# Patient Record
Sex: Male | Born: 1975 | Race: White | Hispanic: No | Marital: Married | Smoking: Never smoker
Health system: Southern US, Community
[De-identification: ages and names within clinical notes are randomized; demographics above are authoritative.]

## PROBLEM LIST (undated history)

## (undated) DIAGNOSIS — H9190 Unspecified hearing loss, unspecified ear: Secondary | ICD-10-CM

## (undated) DIAGNOSIS — I10 Essential (primary) hypertension: Secondary | ICD-10-CM

## (undated) DIAGNOSIS — F429 Obsessive-compulsive disorder, unspecified: Secondary | ICD-10-CM

## (undated) DIAGNOSIS — F419 Anxiety disorder, unspecified: Secondary | ICD-10-CM

## (undated) DIAGNOSIS — K602 Anal fissure, unspecified: Secondary | ICD-10-CM

## (undated) DIAGNOSIS — K219 Gastro-esophageal reflux disease without esophagitis: Secondary | ICD-10-CM

## (undated) DIAGNOSIS — J309 Allergic rhinitis, unspecified: Secondary | ICD-10-CM

## (undated) HISTORY — PX: LASIK: SHX215

## (undated) HISTORY — DX: Obsessive-compulsive disorder, unspecified: F42.9

## (undated) HISTORY — PX: NASAL SEPTUM SURGERY: SHX37

## (undated) HISTORY — DX: Essential (primary) hypertension: I10

## (undated) HISTORY — DX: Anxiety disorder, unspecified: F41.9

## (undated) HISTORY — DX: Anal fissure, unspecified: K60.2

## (undated) HISTORY — DX: Allergic rhinitis, unspecified: J30.9

## (undated) HISTORY — DX: Gastro-esophageal reflux disease without esophagitis: K21.9

## (undated) HISTORY — DX: Unspecified hearing loss, unspecified ear: H91.90

## (undated) HISTORY — PX: VASECTOMY: SHX75

---

## 2001-01-26 ENCOUNTER — Encounter: Payer: Self-pay | Admitting: Emergency Medicine

## 2001-01-26 ENCOUNTER — Emergency Department (HOSPITAL_COMMUNITY): Admission: EM | Admit: 2001-01-26 | Discharge: 2001-01-26 | Payer: Self-pay | Admitting: Emergency Medicine

## 2016-12-11 ENCOUNTER — Encounter: Payer: Self-pay | Admitting: Gastroenterology

## 2016-12-23 ENCOUNTER — Encounter: Payer: Self-pay | Admitting: Neurology

## 2016-12-23 ENCOUNTER — Ambulatory Visit (INDEPENDENT_AMBULATORY_CARE_PROVIDER_SITE_OTHER): Payer: 59 | Admitting: Neurology

## 2016-12-23 VITALS — BP 125/82 | HR 62 | Ht 70.0 in | Wt 288.0 lb

## 2016-12-23 DIAGNOSIS — R5383 Other fatigue: Secondary | ICD-10-CM

## 2016-12-23 DIAGNOSIS — Z6841 Body Mass Index (BMI) 40.0 and over, adult: Secondary | ICD-10-CM | POA: Insufficient documentation

## 2016-12-23 DIAGNOSIS — R0683 Snoring: Secondary | ICD-10-CM

## 2016-12-23 DIAGNOSIS — G478 Other sleep disorders: Secondary | ICD-10-CM

## 2016-12-23 DIAGNOSIS — F513 Sleepwalking [somnambulism]: Secondary | ICD-10-CM | POA: Insufficient documentation

## 2016-12-23 NOTE — Progress Notes (Signed)
SLEEP MEDICINE CLINIC   Provider:  Melvyn Novas, M D  Primary Care Physician:  Tracey Harries, MD   Referring Provider: Tracey Harries, MD   Chief Complaint  Patient presents with  . New Patient (Initial Visit)    pt alone, states that he has difficulty with his sleep, he gave an example of last night pt went to bed 11:30 woke up at 2 went back to sleep woke up 4 and then he couldnt go back to sleep. He went downstairs and then fell back asleep downstairs. pt states this seems to be a pattern.  pt has been off roatating shifts since 2008. pt says it has to be completly quiet to fall asleep. tired thru out the day due to the lack of sleep    HPI:  Carl Taylor is a 41 y.o. male , seen here as in a referral  from Dr. Everlene Other for a sleep evaluation,  I have the pleasure of seeing Carl Taylor today, a Caucasian, right-handed, married gentleman and father of 2, who reports difficulties to sleep through the night. He carries a diagnosis of obsessive-compulsive disorder, well-controlled on SSRI medication, he has had weight gain issues for the last 4 years or longer, he developed a fatty liver. He reports difficulties with daytime fatigue, has 2-3 times nocturia, and his wife observed him snoring. He has trouble to go to sleep, needs a very cool, quiet and dark bedroom.  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   Chief complaint according to patient : "I am unable to sleep with my wife watching TV in the bedroom until midnight ".   Sleep habits are as follows: The patient goes to bed at 11 pm and does not sleep immediately, he needs until midnight - and he wakes up at 2 and 4 AM. His wife has to give the baby medicine at midnight - and stays up- watching TV in the bedroom. He has tried to sleep in a recliner downstairs. He prefers a fan in the bedroom. He got a contour pillow, uses only one. He is a back sleeper since he  gained weight and has back pain. His snoring is loud. Once he is asleep after that, he is difficult to arouse.  He has nocturnal headaches- throbbing , waking him. He wakes up with throbbing headaches , too.  He rememberes difficulties to get up in AM for school. He is at work at 8.30 AM, always late . He reports he  feels all over achy and fatigue. The first step out of bed is horrible - joint pain and stiffness. His headaches resolve after the first caffeine of the day.   Sleep medical history and family sleep history: sleep talking , possible sleep walking. He does not remember the long conversations at night - but his wife and children do. DDD. 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 Chronic fatigue . No tonsillectomy in childhood.  ADD/ OCD.  Septoplasty 12 yaers ago.   His Baby daughter was born with atresia of vagina, rectum and urethra. At 75 month age she developed ADEM. Steroid treatment. Developmentally delayed, hemiparetic on the left side.  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 She has brain demyelination.  She is now 56 years old.  Their son is healthy but had ADD at age 60 .  Social history:  Married, 2 children, one with special needs,  tobacco use -  chews tobacco. ETOH none, Caffeine : lots of caffeine . Coffee or soda throughout the day, 7-8 a day.   Review of Systems: Out of a complete 14 system review, the patient complains of only the following symptoms, and all other reviewed systems are negative.    Epworth score 7, Fatigue severity score 60  , depression score n/a    Social History   Social History  . Marital status: Married    Spouse name: N/A  . Number of children: N/A  . Years of education: N/A   Occupational History  . Not on file.   Social History Main Topics  . Smoking status: Never Smoker  . Smokeless tobacco: Current User    Types: Chew  . Alcohol use No  . Drug use: No  . Sexual activity: Not on file   Other Topics Concern  . Not on  file   Social History Narrative  . No narrative on file    No family history on file.  Past Medical History:  Diagnosis Date  . Allergic rhinitis   . Anxiety   . GERD (gastroesophageal reflux disease)   . OCD (obsessive compulsive disorder)     Past Surgical History:  Procedure Laterality Date  . LASIK    . NASAL SEPTUM SURGERY    . VASECTOMY      Current Outpatient Prescriptions  Medication Sig Dispense Refill  . cetirizine (ZYRTEC) 10 MG tablet Take 20 mg by mouth.    . cyclobenzaprine (FLEXERIL) 10 MG tablet Take 10 mg by mouth.    . DEXILANT 60 MG capsule     . fluticasone (FLONASE) 50 MCG/ACT nasal spray     . lisinopril (PRINIVIL,ZESTRIL) 20 MG tablet Take 20 mg by mouth.    Marland Kitchen PARoxetine (PAXIL) 20 MG tablet Take 20 mg by mouth.     No current facility-administered medications for this visit.     Allergies as of 12/23/2016 - Review Complete 12/23/2016  Allergen Reaction Noted  . Bee venom Other (See Comments) 10/25/2013    Vitals: BP 125/82   Pulse 62   Ht 5\' 10"  (1.778 m)   Wt 288 lb (130.6 kg)   BMI 41.32 kg/m  Last Weight:  Wt Readings from Last 1 Encounters:  12/23/16 288 lb (130.6 kg)   UJW:JXBJ mass index is 41.32 kg/m.     Last Height:   Ht Readings from Last 1 Encounters:  12/23/16 5\' 10"  (1.778 m)    Physical exam:  General: The patient is awake, alert and appears not in acute distress. The patient is well groomed. Head: Normocephalic, atraumatic. Neck is supple. Mallampati 2 ,  neck circumference:19.25 . Nasal airflow patent - had deviated septum surgery,    Retrognathia is not seen.  Cardiovascular:  Regular rate and rhythm , without  murmurs or carotid bruit, and without distended neck veins. Respiratory: Lungs are clear to auscultation. Skin:  Without evidence of edema, or rash Trunk: BMI is 41.3 .  Neurologic exam : The patient is awake and alert, oriented to place and time.   Speech is fluent,  without being pressured, no  dysarthria, dysphonia or aphasia.  Mood and affect are appropriate.  Cranial nerves: Pupils are equal and briskly reactive to light.  Extraocular movements  in vertical and horizontal planes intact and without nystagmus. Visual fields by finger perimetry are intact. Hearing to finger rub intact.  Facial sensation intact to fine touch. Facial motor strength is symmetric and tongue and uvula move  midline. Shoulder shrug was symmetrical.   Motor exam:   Normal tone, muscle bulk and symmetric strength in all extremities.Sensory:  Fine touch, pinprick and vibration were tested in all extremities. Coordination: Finger-to-nose maneuver  normal without evidence of ataxia, dysmetria or tremor. Gait and station: Patient walks without assistive device and is able unassisted to climb up to the exam table. Strength within normal limits. Stance is stable and normal.  Deep tendon reflexes: in the  upper and lower extremities are symmetric and intact. Babinski maneuver response is downgoing.  Assessment:  After physical and neurologic examination, review of laboratory studies,  Personal review of imaging studies, reports of other /same  Imaging studies, results of polysomnography and / or neurophysiology testing and pre-existing records as far as provided in visit., my assessment is   1)hypersomnia - moderate , but high degree of fatigue. He never naps, he has no sleep attacks. He snores.   this patient is at a high risk, risk factors  of having OSA- morbid obsese by BMI and neck size, risk are in addition his age and gender. I will order a sleep study.   2) sleep hygiene is poor, 5-6 hours in a good night, surrounding bedroom has to be modified - calming- quiet , dark and cool.  Make 10.30 your bedtime, take a hot shower before bedtime, no screen light.   3)  Obesity-  Low carb diet - not a no carb diet . Not Atkins! Exercise after dinner , but more than 2 hours before bedtime.  Caffeine reduction after lunch  .  The patient was advised of the nature of the diagnosed disorder , the treatment options and the  risks for general health and wellness arising from not treating the condition.   I spent more than 45 minutes of face to face time with the patient.  Greater than 50% of time was spent in counseling and coordination of care. We have discussed the diagnosis and differential and I answered the patient's questions.    Plan:  Treatment plan and additional workup :   1) OSA risk is high- PSG with SPLIT study,  2) cluster headache, caffeine abuse, need hypoxemia watch, capnography if possible.   3) insomnia guidelines printed.   Melvyn Novas, MD 12/23/2016, 8:21 AM  Certified in Neurology by ABPN Certified in Sleep Medicine by [facility] [address] [place], [place] [address]

## 2016-12-23 NOTE — Patient Instructions (Signed)

## 2017-01-15 ENCOUNTER — Encounter: Payer: Self-pay | Admitting: Gastroenterology

## 2017-01-15 ENCOUNTER — Ambulatory Visit (INDEPENDENT_AMBULATORY_CARE_PROVIDER_SITE_OTHER): Payer: 59 | Admitting: Gastroenterology

## 2017-01-15 VITALS — BP 110/78 | HR 68 | Ht 70.0 in | Wt 266.0 lb

## 2017-01-15 DIAGNOSIS — L290 Pruritus ani: Secondary | ICD-10-CM | POA: Diagnosis not present

## 2017-01-15 DIAGNOSIS — K921 Melena: Secondary | ICD-10-CM | POA: Diagnosis not present

## 2017-01-15 MED ORDER — CLOTRIMAZOLE 1 % EX CREA: TOPICAL_CREAM | CUTANEOUS | 0 refills | Status: AC

## 2017-01-15 NOTE — Progress Notes (Signed)
History of Present Illness: This is a 41 year old male referred by Tracey Harries, MD for the evaluation of hematochezia and anal itching. Patient relates he was diagnosed with an anal fissure several years ago. He has had problems with intermittent constipation and hard stools for several years. He notes anal and perianal itching that bothers him most days for the past year. Occasionally notes a small amount of bright red blood with bowel movements. She was prescribed a hemorrhoidal cream but does not recall the name of the product. It provides temporary relief of symptoms. No prior colonoscopy. Denies weight loss, abdominal pain, constipation, diarrhea, change in stool caliber, melena, nausea, vomiting, dysphagia, reflux symptoms, chest pain.   Allergies  Allergen Reactions  . Bee Venom Other (See Comments)   Outpatient Medications Prior to Visit  Medication Sig Dispense Refill  . cetirizine (ZYRTEC) 10 MG tablet Take 20 mg by mouth.    . cyclobenzaprine (FLEXERIL) 10 MG tablet Take 10 mg by mouth.    . DEXILANT 60 MG capsule     . fluticasone (FLONASE) 50 MCG/ACT nasal spray     . lisinopril (PRINIVIL,ZESTRIL) 20 MG tablet Take 20 mg by mouth.    Marland Kitchen PARoxetine (PAXIL) 20 MG tablet Take 20 mg by mouth.     No facility-administered medications prior to visit.    Past Medical History:  Diagnosis Date  . Allergic rhinitis   . Anal fissure   . Anxiety   . GERD (gastroesophageal reflux disease)   . Hearing loss   . Hypertension   . OCD (obsessive compulsive disorder)    Past Surgical History:  Procedure Laterality Date  . LASIK    . NASAL SEPTUM SURGERY    . VASECTOMY     Social History   Social History  . Marital status: Married    Spouse name: N/A  . Number of children: 2  . Years of education: N/A   Social History Main Topics  . Smoking status: Never Smoker  . Smokeless tobacco: Current User    Types: Chew  . Alcohol use No  . Drug use: No  . Sexual activity: Not  Asked   Other Topics Concern  . None   Social History Narrative  . None   Family History  Problem Relation Age of Onset  . Hypertension Paternal Grandfather   . Diabetes Paternal Grandfather   . Autoimmune disease Daughter        Review of Systems: Pertinent positive and negative review of systems were noted in the above HPI section. All other review of systems were otherwise negative.    Physical Exam: General: Well developed, well nourished, no acute distress Head: Normocephalic and atraumatic Eyes:  sclerae anicteric, EOMI Ears: Normal auditory acuity Mouth: No deformity or lesions Neck: Supple, no masses or thyromegaly Lungs: Clear throughout to auscultation Heart: Regular rate and rhythm; no murmurs, rubs or bruits Abdomen: Soft, non tender and non distended. No masses, hepatosplenomegaly or hernias noted. Normal Bowel sounds Rectal: perianal patchy erythema and hypopigmentation in a circular area around the anus, no anal abnormalities, no fissure noted, normal anal skin folds, no internal lesions noted, no anal canal tenderness, Hemoccult-negative brown stool Musculoskeletal: Symmetrical with no gross deformities  Skin: No lesions on visible extremities Pulses:  Normal pulses noted Extremities: No clubbing, cyanosis, edema or deformities noted Neurological: Alert oriented x 4, grossly nonfocal Cervical Nodes:  No significant cervical adenopathy Inguinal Nodes: No significant inguinal adenopathy Psychological:  Alert and  cooperative. Normal mood and affect  Assessment and Recommendations:  1.  Perianal dermatitis. Lotrimin cream 3 times a day for 1 week. If symptoms persist proceed with dermatology referral. May use Tucks pads when necessary but he is advised not to use any other creams lotions or ointments.  2. Intermittent hematochezia. Possible internal hemorrhoids. Rule out colorectal neoplasms, proctitis and other disorders. Schedule colonoscopy. The risks  (including bleeding, perforation, infection, missed lesions, medication reactions and possible hospitalization or surgery if complications occur), benefits, and alternatives to colonoscopy with possible biopsy and possible polypectomy were discussed with the patient and they consent to proceed.    2. Hematochezia.  cc: Tracey Harries, MD

## 2017-01-15 NOTE — Patient Instructions (Signed)
You have been scheduled for a colonoscopy. Please follow written instructions given to you at your visit today.  Please pick up your prep supplies at the pharmacy within the next 1-3 days. If you use inhalers (even only as needed), please bring them with you on the day of your procedure. Your physician has requested that you go to www.startemmi.com and enter the access code given to you at your visit today. This web site gives a general overview about your procedure. However, you should still follow specific instructions given to you by our office regarding your preparation for the procedure.  We have sent the following medications to your pharmacy for you to pick up at your convenience: Lotrimin AF Cream-Apply perianally twice daily x 1 week  Please purchase the following medications over the counter and take as directed: TUCKS pads as needed for rectal itching  If you are age 42 or older, your body mass index should be between 23-30. Your Body mass index is 38.17 kg/m. If this is out of the aforementioned range listed, please consider follow up with your Primary Care Provider.  If you are age 36 or younger, your body mass index should be between 19-25. Your Body mass index is 38.17 kg/m. If this is out of the aformentioned range listed, please consider follow up with your Primary Care Provider.

## 2017-02-12 ENCOUNTER — Ambulatory Visit (INDEPENDENT_AMBULATORY_CARE_PROVIDER_SITE_OTHER): Payer: 59 | Admitting: Neurology

## 2017-02-12 DIAGNOSIS — F513 Sleepwalking [somnambulism]: Secondary | ICD-10-CM

## 2017-02-12 DIAGNOSIS — G4733 Obstructive sleep apnea (adult) (pediatric): Secondary | ICD-10-CM

## 2017-02-12 DIAGNOSIS — R5383 Other fatigue: Secondary | ICD-10-CM

## 2017-02-12 DIAGNOSIS — J9611 Chronic respiratory failure with hypoxia: Secondary | ICD-10-CM

## 2017-02-12 DIAGNOSIS — R0683 Snoring: Secondary | ICD-10-CM

## 2017-02-12 DIAGNOSIS — G4734 Idiopathic sleep related nonobstructive alveolar hypoventilation: Secondary | ICD-10-CM

## 2017-02-12 DIAGNOSIS — Z6841 Body Mass Index (BMI) 40.0 and over, adult: Secondary | ICD-10-CM

## 2017-02-12 DIAGNOSIS — G478 Other sleep disorders: Secondary | ICD-10-CM

## 2017-02-12 DIAGNOSIS — J9612 Chronic respiratory failure with hypercapnia: Secondary | ICD-10-CM

## 2017-02-16 NOTE — Addendum Note (Signed)
Addended by: Melvyn NovasHMEIER, Philo Kurtz on: 02/16/2017 04:52 PM   Modules accepted: Orders

## 2017-02-16 NOTE — Procedures (Signed)
PATIENT'S NAME:  Carl Taylor, Carl Taylor DOB:      May 27, 1975      MR#:    [id number]     DATE OF RECORDING: 02/12/2017 REFERRING M.D.:  Tracey Harries, MD Study Performed:   Baseline Polysomnogram HISTORY: sleep talking, walking, Class 3 obesity, snoring and fatigue.  Mr. Graumann reports difficulties to sleep through the night. He carries a diagnosis of obsessive-compulsive disorder, well-controlled on SSRI medication, he has had weight gain issues for the last 4 years or longer, he developed a fatty liver. He reports difficulties with daytime fatigue, has 2-3 times nocturia, and his wife observed him snoring.  The patient endorsed the Epworth Sleepiness Scale at 7 points.   The patient's weight 288 pounds with a height of 70 (inches), resulting in a BMI of 41.3 kg/m2. The patient's neck circumference measured 19 inches.  CURRENT MEDICATIONS: Zyrtec, Flexeril, Dexilant, Flonase, Zestril, Paxil   PROCEDURE:  This is a multichannel digital polysomnogram utilizing the Somnostar 11.2 system.  Electrodes and sensors were applied and monitored per AASM Specifications.   EEG, EOG, Chin and Limb EMG, were sampled at 200 Hz.  ECG, Snore and Nasal Pressure, Thermal Airflow, Respiratory Effort, CPAP Flow and Pressure, Oximetry was sampled at 50 Hz. Digital video and audio were recorded.      BASELINE STUDY : Lights Out was at 22:15 and Lights On at 05:00.  Total recording time (TRT) was 405 minutes, with a total sleep time (TST) of 365 minutes.   The patient's sleep latency was 7.5 minutes.  REM latency was 336.5 minutes.  The sleep efficiency was 90.1 %.     SLEEP ARCHITECTURE: WASO (Wake after sleep onset) was 33.5 minutes.  There were 23 minutes in Stage N1, 240 minutes Stage N2, 46 minutes Stage N3 and 56 minutes in Stage REM.  The percentage of Stage N1 was 6.3%, Stage N2 was 65.8%, Stage N3 was 12.6% and Stage R (REM sleep) was 15.3%.   RESPIRATORY ANALYSIS:  There were a total of 59 respiratory events:  11  obstructive apneas, 1 central apnea and 47 hypopneas with 0 respiratory event related arousals (RERAs).    The total APNEA/HYPOPNEA INDEX (AHI) was 9.7/hour and the total RESPIRATORY DISTURBANCE INDEX was 9.7 /hour.  0 events occurred in REM sleep and 95 events in NREM.  The REM AHI was 0.0 /hour, versus a non-REM AHI of 11.5.  The patient spent 86.5 minutes of total sleep time in the supine position and 279 minutes in non-supine.  The supine AHI was 31.2 versus a non-supine AHI of 3.0.  OXYGEN SATURATION & C02:  The Wake baseline 02 saturation was 96%, with the lowest being 80%. Time spent below 89% saturation equaled 54 minutes.  End Tidal CO2 during sleep was 42.7 torr.  During REM, the highest End Tidal CO2 was 43.4 torr.  During NREM sleep, the highest End Tidal CO2 was 42.6 torr.  Total sleep time greater than 40 torr was 80.50 minutes.   PERIODIC LIMB MOVEMENTS:   The patient had a total of 74 Periodic Limb Movements.  The Periodic Limb Movement (PLM) index was 12.2 and the PLM Arousal index was 1.2/hour. The arousals were noted as: 43 were spontaneous, 7 were associated with PLMs, and 49 were associated with respiratory events.  Audio and video analysis did not show any abnormal or unusual movements, behaviors, phonations or vocalizations.   The patient took 2 bathroom breaks. Loud Snoring was noted in supine. EKG was in keeping with normal  sinus rhythm (NSR). Post-study, the patient indicated that sleep was the same as usual.    IMPRESSION:  1. Obstructive Sleep Apnea(OSA) 2. Periodic Limb Movement Disorder (PLMD) 3. Snoring 4. Hypoxemia and hypercapnia present  RECOMMENDATIONS:  1. Advise full-night, attended, CPAP titration study to optimize therapy. This patient may need additional oxygen supplementation.    2. There were periodic limb movements of sleep (PLMS) with some associated sleep disruption.  Consider treating the PLMS primarily if correlated to RLS.   3. Positional  therapy is advised. 4. Avoid sedative-hypnotics which may worsen sleep apnea, alcohol and tobacco (as applicable). 5. Advise to lose weight, diet and exercise if not contraindicated (BMI 41.3). 6. Further information regarding OSA may be obtained from [employer] (www.sleepfoundation.org) or American Sleep Apnea Association (www.sleepapnea.org). 7. A follow up appointment [facility]. The referring provider will be notified of the results.      I certify that I have reviewed the entire raw data recording prior to the issuance of this report in accordance with the Standards of Accreditation of the American Academy of Sleep Medicine (AASM)      Melvyn Novasarmen Tamirra Sienkiewicz, MD    02-16-2017  Diplomat, American Board of Psychiatry and Neurology  Diplomat, American Board of Sleep Medicine Medical Director of [facility]

## 2017-02-17 ENCOUNTER — Telehealth: Payer: Self-pay | Admitting: Neurology

## 2017-02-17 NOTE — Telephone Encounter (Signed)
I called pt. I advised pt that Dr. Vickey Hugerohmeier reviewed their sleep study results and found that pt has sleep apnea. Dr. Vickey Hugerohmeier recommends that pt return for a CPAP titration study to assess what pressure to set CPAP. Informed the pt that the sleep lab will contact him once insurance approval has went thru. Pt verbalized understanding of results. Pt had no questions at this time but was encouraged to call back if questions arise.

## 2017-02-17 NOTE — Telephone Encounter (Signed)
-----   Message from Melvyn Novasarmen Dohmeier, MD sent at 02/16/2017  4:52 PM EDT ----- Apnea is found , associated with hypercapnia and hypoxemia- this requires CPAP treatment. Further recommendations of weight loss and avoiding supine sleep .  Cc Dr Everlene OtherBouska

## 2017-03-04 ENCOUNTER — Encounter: Payer: Self-pay | Admitting: Gastroenterology

## 2017-03-12 ENCOUNTER — Encounter: Payer: 59 | Admitting: Gastroenterology

## 2017-04-02 ENCOUNTER — Ambulatory Visit (INDEPENDENT_AMBULATORY_CARE_PROVIDER_SITE_OTHER): Payer: 59 | Admitting: Neurology

## 2017-04-02 DIAGNOSIS — G4733 Obstructive sleep apnea (adult) (pediatric): Secondary | ICD-10-CM

## 2017-04-02 DIAGNOSIS — J9611 Chronic respiratory failure with hypoxia: Secondary | ICD-10-CM

## 2017-04-02 DIAGNOSIS — G4734 Idiopathic sleep related nonobstructive alveolar hypoventilation: Secondary | ICD-10-CM

## 2017-04-02 DIAGNOSIS — G478 Other sleep disorders: Secondary | ICD-10-CM

## 2017-04-02 DIAGNOSIS — R5383 Other fatigue: Secondary | ICD-10-CM

## 2017-04-02 DIAGNOSIS — Z6841 Body Mass Index (BMI) 40.0 and over, adult: Secondary | ICD-10-CM

## 2017-04-02 DIAGNOSIS — R0683 Snoring: Secondary | ICD-10-CM

## 2017-04-02 DIAGNOSIS — J9612 Chronic respiratory failure with hypercapnia: Secondary | ICD-10-CM

## 2017-04-14 ENCOUNTER — Other Ambulatory Visit: Payer: Self-pay | Admitting: Neurology

## 2017-04-14 DIAGNOSIS — G478 Other sleep disorders: Secondary | ICD-10-CM

## 2017-04-14 DIAGNOSIS — G4733 Obstructive sleep apnea (adult) (pediatric): Secondary | ICD-10-CM

## 2017-04-14 DIAGNOSIS — Z6841 Body Mass Index (BMI) 40.0 and over, adult: Principal | ICD-10-CM

## 2017-04-14 NOTE — Procedures (Signed)
PATIENT'S NAME:  Vanessa KickFuller, Navid DOB:      Aug 28, 1975      MR#:    [id number]     DATE OF RECORDING: 04/02/2017 REFERRING M.D.:  Tracey Harriesavid Bouska, MD Study Performed:   Titration to CPAP HISTORY:  Mr. Toni ArthursFuller is returning for CPAP titration following a PSG performed on 02/12/2017 which resulted in an AHI of 9.7, supine AHI of 31.1/hr.  and low SpO2 of 80%.There were frequent PLMs. The baseline study documented prolonged hypercapnia.  The patient endorsed the Epworth Sleepiness Scale at 7 points.  The patient's weight 289 pounds with a height of 70 (inches), resulting in a BMI of 41.3 kg/m2.The patient's neck circumference measured 19 inches.  CURRENT MEDICATIONS: Zyrtec, Flexeril, Dexilant, Flonase, Zestril, Paxil  PROCEDURE:  This is a multichannel digital polysomnogram utilizing the SomnoStar 11.2 system.  Electrodes and sensors were applied and monitored per AASM Specifications.   EEG, EOG, Chin and Limb EMG, were sampled at 200 Hz.  ECG, Snore and Nasal Pressure, Thermal Airflow, Respiratory Effort, CPAP Flow and Pressure, Oximetry was sampled at 50 Hz. Digital video and audio were recorded.      CPAP was initiated at 5 cmH20 with heated humidity per AASM split night standards and pressure was advanced to 7/7cmH20 because of hypopneas, apneas and desaturations.  At a PAP pressure of 7 cmH20, there was a reduction of the AHI to 0.0 with improvement of the sleep apnea. The Spo2 nadir remained low at 85%, sleep efficiency was good at 97%.    Lights Out was at 00:04 and Lights On at 06:37. Total recording time (TRT) was 393.5 minutes, with a total sleep time (TST) of 339.5 minutes. The patient's sleep latency was 22.5 minutes with 0.5 minutes of wake time after sleep onset. REM latency was 244.5 minutes.  The sleep efficiency was 86.3 %.    SLEEP ARCHITECTURE: WASO (Wake after sleep onset) was 31 minutes.  There were 29.5 minutes in Stage N1, 120.5 minutes Stage N2, 68 minutes Stage N3 and 121.5 minutes in  Stage REM.  The percentage of Stage N1 was 8.7%, of Stage N2 was 35.5%, of Stage N3 was 20.0% and Stage R (REM sleep) was 35.8%.  RESPIRATORY ANALYSIS:  There was a total of 0 respiratory events: The patient also had 0 respiratory event related arousals (RERAs).     The total APNEA/HYPOPNEA INDEX (AHI) was 0.0 /hour and REM and NREM AHI were 0.0 /hr. The patient spent 198.5 minutes of total sleep time in the supine position and 141 minutes in non-supine. The supine AHI was 0.0, versus a non-supine AHI of 0.0.  OXYGEN SATURATION & C02:  The baseline 02 saturation was 95%, with the lowest being 84%. Time spent below 89% saturation equaled 8 minutes. PERIODIC LIMB MOVEMENTS:  The patient had a total of 58 Periodic Limb Movements. The Periodic Limb Movement (PLM) index was 10.3 and the PLM Arousal index was 0.2 /hour. Post-study, the patient indicated that sleep was the same as usual.     DIAGNOSIS;              2.   Obstructive Sleep Apnea responding well to 7 cm water CPAP.  1. Hypoxemia resolved under CPAP.  PLANS/RECOMMENDATIONS: The patient was fitted with a Respironics Dream wear mask in medium size. Auto-titration capable CPAP shall be set to 7 cm water, under heated humidity.   A follow up appointment will be scheduled in [facility].   Please  call [phone] with any questions.     I certify that I have reviewed the entire raw data recording prior to the issuance of this report in accordance with the Standards of Accreditation of the American Academy of Sleep Medicine (AASM)   Melvyn Novasarmen Jaythen Hamme, M.D.    04-14-2017  Diplomat, American Board of Psychiatry and Neurology  Diplomat, American Board of Sleep Medicine Medical Director, [facility]

## 2017-04-15 ENCOUNTER — Telehealth: Payer: Self-pay | Admitting: Neurology

## 2017-04-15 NOTE — Telephone Encounter (Signed)
Called patient to discuss sleep study results. No answer at this time. LVM for the patient to call back.   

## 2017-04-15 NOTE — Telephone Encounter (Signed)
I called pt. I advised pt that Dr. Vickey Hugerohmeier reviewed their sleep study results and found that pt has sleep apnea. Dr. Vickey Hugerohmeier recommends that pt starts CPAP. I reviewed PAP compliance expectations with the pt. Pt is agreeable to starting a CPAP. I advised pt that an order will be sent to a DME, [facility], and [facility] will call the pt within about one week after they file with the pt's insurance. [facility] will show the pt how to use the machine, fit for masks, and troubleshoot the CPAP if needed. A follow up appt was made for insurance purposes with Darrol Angelarolyn Martin, NP on July 13 2017 at 9:15 am. Pt verbalized understanding to arrive 15 minutes early and bring their CPAP. A letter with all of this information in it will be mailed to the pt as a reminder. I verified with the pt that the address we have on file is correct. Pt verbalized understanding of results. Pt had no questions at this time but was encouraged to call back if questions arise.

## 2017-05-14 ENCOUNTER — Encounter: Payer: 59 | Admitting: Gastroenterology

## 2017-07-01 NOTE — Telephone Encounter (Signed)
Patient called today to change his apt due to the fact that he had a work event that came up. I was able to place him in on another opening for the 13th with Dr Vickey Hugerohmeier

## 2017-07-05 ENCOUNTER — Encounter: Payer: Self-pay | Admitting: Neurology

## 2017-07-07 ENCOUNTER — Encounter: Payer: Self-pay | Admitting: Neurology

## 2017-07-07 ENCOUNTER — Ambulatory Visit: Payer: 59 | Admitting: Neurology

## 2017-07-07 VITALS — BP 138/88 | HR 63 | Ht 70.0 in | Wt 255.0 lb

## 2017-07-07 DIAGNOSIS — Z6841 Body Mass Index (BMI) 40.0 and over, adult: Secondary | ICD-10-CM

## 2017-07-07 DIAGNOSIS — F513 Sleepwalking [somnambulism]: Secondary | ICD-10-CM | POA: Diagnosis not present

## 2017-07-07 DIAGNOSIS — R0683 Snoring: Secondary | ICD-10-CM | POA: Diagnosis not present

## 2017-07-07 DIAGNOSIS — G4733 Obstructive sleep apnea (adult) (pediatric): Secondary | ICD-10-CM | POA: Diagnosis not present

## 2017-07-07 DIAGNOSIS — Z9989 Dependence on other enabling machines and devices: Secondary | ICD-10-CM

## 2017-07-07 NOTE — Progress Notes (Signed)
[facility]   Provider:  Melvyn Novas, M D  Primary Care Physician:  Tracey Harries, MD   Referring Provider: Tracey Harries, MD   Chief Complaint  Patient presents with  . Follow-up    pt alone, rm 10. pt states that CPAP seems to be working well. in the last couple nights he has noticed he has pulled it off in sleep and isnt aware. he is on his second mask which seems to be the better one.    HPI:  MIECZYSLAW STAMAS is a 42 y.o. male , seen here as in a referral  from Dr. Everlene Other for a sleep evaluation,  I have the pleasure of seeing Mr. Vallez today, a Caucasian, right-handed, married gentleman and father of 2, who reports difficulties to sleep through the night. Chief complaint according to patient : "I am unable to sleep with my wife watching TV in the bedroom until midnight ".   He carries a diagnosis of obsessive-compulsive disorder, well-controlled on SSRI medication, he has had weight gain issues for the last 4 years or longer, he developed a fatty liver.He reports difficulties with daytime fatigue, has 2-3 times nocturia, and his wife observed him snoring. He has trouble to go to sleep, needs a very cool, quiet and dark bedroom. 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    Sleep habits are as follows:The patient goes to bed at 11 pm and does not sleep immediately, he needs until midnight - and he wakes up at 2 and 4 AM. His wife has to give the baby medicine at midnight - and stays up- watching TV in the bedroom. He has tried to sleep in a recliner downstairs. He prefers a fan in the bedroom. He got a contour pillow, uses only one. He is a back sleeper since he gained weight and has back pain. His snoring is loud. Once he is asleep after that, he is difficult to arouse.  He has nocturnal headaches- throbbing , waking him. He wakes up with throbbing headaches , too.  He rememberes difficulties to get  up in AM for school. He is at work at 8.30 AM, always late . He reports he  feels all over achy and fatigue. The first step out of bed is horrible - joint pain and stiffness. His headaches resolve after the first caffeine of the day.   Sleep medical history and family sleep history: sleep talking , possible sleep walking. He does not remember the long conversations at night - but his wife and children do. DDD. 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 Chronic fatigue . No tonsillectomy in childhood.  ADD/ OCD.  Septoplasty 12 yaers ago.  His Baby daughter was born with atresia of vagina, rectum and urethra. At 20 month age she developed ADEM. Steroid treatment. Developmentally delayed, hemiparetic on the left side. 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 She has brain demyelination.  She is now 59 years old.  Their son is healthy but had ADD at age 30. Social history:  Married, 2 children, one with special needs,  tobacco use - chews tobacco. ETOH none, Caffeine : lots of caffeine . Coffee or soda throughout the day, 7-8 a day. 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   I have the pleasure of following up with Lister Brizzi Luviano today on 07 July 2017.  Mr. Patras underwent a baseline polysomnography on 12 February 2017 which revealed a mild form of sleep apnea that was exacerbated in supine sleep position.  While his sleep apnea was mild it was associated with prolonged hypoxemia a total of 54 minutes of sleep time were spent below 89% oxygen saturation.  He also retained CO2.  He had frequent limb movements and moderate frequently of limb movement related arousals.  He took 2 bathroom breaks which is above average.  Loud snoring was noted when in supine.  Based on this I asked asked the patient to return for a CPAP titration and hope that also the periodic limb movements would improve once he is on CPAP.  His supine AHI was 31.2, his overall  AHI was 11.5.  Based on this distribution and in light of the hypoxemia he was considered a CPAP candidate and not a dental device failure.  He return for CPAP titration on 02 April 2017, AHI index was 0.0 once a pressure of 7 cm water was reached, he still had some periods of low oxygen but the total time and oxygen desaturation was very reduced to 8 minutes, sleep efficiency was 86%.  I recommended to start CPAP at 7 cmH2O.  He was referred to [facility] and has used the CPAP 30 out of 30 days.  I would like to add that the patient does have called at night that some nights will be cut short by this is compliance for over 4 hours of consecutive use is 70% and his average usage is 5 hours and 3 minutes he was a 7 cm water pressure with a residual AHI of 3.3 95th percentile air leak is a little higher, no central apneas have emerged, CPAP 95th percentile pressure likely needs to be elevated by 1 cm to 8 cm water, EPR reduced to 2 cm water.  He likes to sleep on his back>    Review of Systems: Out of a complete 14 system review, the patient complains of only the following symptoms, and all other reviewed systems are negative.    Epworth score 1 from 7 points- Fatigue severity score  9 from 60 points - great benefit of CPAP use. No longer nocturia on CPAP>   Social History   Socioeconomic History  . Marital status: Married    Spouse name: Not on file  . Number of children: 2  . Years of education: Not on file  . Highest education level: Not on file  Social Needs  . Financial resource strain: Not on file  . Food insecurity - worry: Not on file  . Food insecurity - inability: Not on file  . Transportation needs - medical: Not on file  . Transportation needs - non-medical: Not on file  Occupational History  . Not on file  Tobacco Use  . Smoking status: Never Smoker  . Smokeless tobacco: Current User    Types: Chew  Substance and Sexual Activity  . Alcohol  use: No  . Drug use: No  . Sexual activity: Not on file  Other Topics Concern  . Not on file  Social History Narrative  . Not on file    Family History  Problem Relation Age of Onset  . Hypertension Paternal Grandfather   . Diabetes Paternal Grandfather   . Autoimmune disease Daughter     Past Medical History:  Diagnosis Date  . Allergic rhinitis   . Anal fissure   . Anxiety   .  GERD (gastroesophageal reflux disease)   . Hearing loss   . Hypertension   . OCD (obsessive compulsive disorder)     Past Surgical History:  Procedure Laterality Date  . LASIK    . NASAL SEPTUM SURGERY    . VASECTOMY      Current Outpatient Medications  Medication Sig Dispense Refill  . cetirizine (ZYRTEC) 10 MG tablet Take 20 mg by mouth.    . clotrimazole (LOTRIMIN AF) 1 % cream Apply perianally twice daily x 1 week 30 g 0  . DEXILANT 60 MG capsule     . fluticasone (FLONASE) 50 MCG/ACT nasal spray     . PARoxetine (PAXIL) 20 MG tablet Take 20 mg by mouth.     No current facility-administered medications for this visit.     Allergies as of 07/07/2017 - Review Complete 07/07/2017  Allergen Reaction Noted  . Bee venom Other (See Comments) 10/25/2013    Vitals: BP 138/88   Pulse 63   Ht 5\' 10"  (1.778 m)   Wt 255 lb (115.7 kg)   BMI 36.59 kg/m  Last Weight:  289 pounds - BMI was 41.3    Wt Readings from Last 1 Encounters:  07/07/17 255 lb (115.7 kg)   WUJ:WJXBBMI:Body mass index is 36.59 kg/m.     Last Height:   Ht Readings from Last 1 Encounters:  07/07/17 5\' 10"  (1.778 m)    Physical exam:  General: The patient is awake, alert and appears not in acute distress. The patient is well groomed. Head: Normocephalic, atraumatic. Neck is supple. Mallampati 2 ,  neck circumference:19.25 . Nasal airflow patent - had deviated septum surgery,    Retrognathia is not seen.  Cardiovascular:  Regular rate and rhythm , without  murmurs or carotid bruit, and without distended neck  veins. Respiratory: Lungs are clear to auscultation. Skin:  Without evidence of edema, or rash Trunk: BMI is 41.3 .  Neurologic exam : The patient is awake and alert, oriented to place and time.   Speech is fluent,  without being pressured, no dysarthria, dysphonia or aphasia.  Mood and affect are appropriate.  Cranial nerves: Pupils are equal and briskly reactive to light.  Extraocular movements  in vertical and horizontal planes intact and without nystagmus. Visual fields by finger perimetry are intact. Hearing to finger rub intact.  Facial sensation intact to fine touch. Facial motor strength is symmetric and tongue and uvula move midline. Shoulder shrug was symmetrical.   Motor exam:   Normal tone, muscle bulk and symmetric strength in all extremities.Sensory:  Fine touch, pinprick and vibration were tested in all extremities. Coordination: Finger-to-nose maneuver  normal without evidence of ataxia, dysmetria or tremor. Gait and station: Patient walks without assistive device and is able unassisted to climb up to the exam table. Strength within normal limits. Stance is stable and normal.  Deep tendon reflexes: in the  upper and lower extremities are symmetric and intact. Babinski maneuver response is downgoing.  Assessment:  After physical and neurologic examination, review of laboratory studies,  Personal review of imaging studies, reports of other /same  Imaging studies, results of polysomnography and / or neurophysiology testing and pre-existing records as far as provided in visit., my assessment is   1)hypersomnia  No reduced on CPAP - cured, really, his  Epworth score  Is at 1 points on CPAP  from 7 points- Fatigue severity score  9 from 60 points - great benefit of CPAP use. No longer nocturia on CPAP>  2) OSA- morbid obsese by BMI and neck size, risk are in addition his age and gender. He will have an increase of CPAP pressure to 8 cm water m and EPR reduction by 2 cm water.   3)  sleep hygiene is improved , 5-6 hours in a good night, surrounding bedroom has to be modified - calming- quiet , dark and cool.  Make 10.30 your bedtime, take a hot shower before bedtime, no screen light.   4) improved  Obesity since October - he has started a low carb diet - not a no carb diet . Not Atkins! Not keto-  He is on nutrasystem - lost 40 pounds !!!!   The patient was advised of the nature of the diagnosed disorder , the treatment options and the  risks for general health and wellness arising from not treating the condition.   I spent more than 25 minutes of face to face time with the patient.  Greater than 50% of time was spent in counseling and coordination of care. We have discussed the diagnosis and differential and I answered the patient's questions.    Plan:  Treatment plan and additional workup :   1) OSA well controlled on CPAP, hypoxemia improved and benefit from CPAP therapy noted : better energy, less Nocturia. Epworth and fatigue score reduced significantly.   Weight loss  significant - he was able to get off Lisinopril blood pressure medication   2) cluster headaches have improved , caffeine use reduced-  insomnia guidelines printed.   3) sinus headaches have improved with humidification  CPAP 8 cm , 2 cm EPR - N 20 Air fit interface   Melvyn Novas, MD 07/07/2017, 10:39 AM  Certified in Neurology by ABPN Certified in Sleep Medicine by [facility] [address], [address] [place], [place] [address]

## 2017-07-07 NOTE — Patient Instructions (Signed)
CPAP  5- 8 cm  Auto setting , with 2 cm EPR - uses N 20 Air fit interface .

## 2017-07-13 ENCOUNTER — Ambulatory Visit: Payer: Self-pay | Admitting: Nurse Practitioner

## 2017-08-30 ENCOUNTER — Encounter: Payer: Self-pay | Admitting: Neurology

## 2018-07-11 ENCOUNTER — Telehealth: Payer: Self-pay | Admitting: Neurology

## 2018-07-11 ENCOUNTER — Ambulatory Visit: Payer: 59 | Admitting: Neurology

## 2018-07-11 NOTE — Telephone Encounter (Signed)
Called the patient to discuss rescheduling his apt that was scheduled for today at 3:30 pm.  LVM for the patient to call back. Pt is seen as a yearly CPAP follow up.   **If patient calls back please reschedule first available with MD, Butch Penny, NP, Shanda Bumps NP, Shawnie Dapper, NP for yearly CPAP follow up.  Please make sure patient brings CPAP with him as the current 30 day download appears he has only used the machine 5/30 days.

## 2019-01-03 ENCOUNTER — Ambulatory Visit: Payer: 59 | Admitting: Family Medicine

## 2019-01-03 ENCOUNTER — Other Ambulatory Visit: Payer: Self-pay

## 2019-01-03 ENCOUNTER — Encounter: Payer: Self-pay | Admitting: Family Medicine

## 2019-01-03 ENCOUNTER — Telehealth: Payer: Self-pay | Admitting: *Deleted

## 2019-01-03 VITALS — BP 120/78 | HR 60 | Temp 97.7°F | Ht 70.0 in | Wt 285.0 lb

## 2019-01-03 DIAGNOSIS — G4733 Obstructive sleep apnea (adult) (pediatric): Secondary | ICD-10-CM | POA: Diagnosis not present

## 2019-01-03 DIAGNOSIS — Z9989 Dependence on other enabling machines and devices: Secondary | ICD-10-CM | POA: Diagnosis not present

## 2019-01-03 NOTE — Patient Instructions (Signed)
We will send an order for mask refitting  Please continue working on weight loss efforts. I recommend low glycemic diet  Follow up in 3 months, sooner if needed   Sleep Apnea Sleep apnea affects breathing during sleep. It causes breathing to stop for a short time or to become shallow. It can also increase the risk of:  Heart attack.  Stroke.  Being very overweight (obese).  Diabetes.  Heart failure.  Irregular heartbeat. The goal of treatment is to help you breathe normally again. What are the causes? There are three kinds of sleep apnea:  Obstructive sleep apnea. This is caused by a blocked or collapsed airway.  Central sleep apnea. This happens when the brain does not send the right signals to the muscles that control breathing.  Mixed sleep apnea. This is a combination of obstructive and central sleep apnea. The most common cause of this condition is a collapsed or blocked airway. This can happen if:  Your throat muscles are too relaxed.  Your tongue and tonsils are too large.  You are overweight.  Your airway is too small. What increases the risk?  Being overweight.  Smoking.  Having a small airway.  Being older.  Being male.  Drinking alcohol.  Taking medicines to calm yourself (sedatives or tranquilizers).  Having family members with the condition. What are the signs or symptoms?  Trouble staying asleep.  Being sleepy or tired during the day.  Getting angry a lot.  Loud snoring.  Headaches in the morning.  Not being able to focus your mind (concentrate).  Forgetting things.  Less interest in sex.  Mood swings.  Personality changes.  Feelings of sadness (depression).  Waking up a lot during the night to pee (urinate).  Dry mouth.  Sore throat. How is this diagnosed?  Your medical history.  A physical exam.  A test that is done when you are sleeping (sleep study). The test is most often done in a sleep lab but may also  be done at home. How is this treated?   Sleeping on your side.  Using a medicine to get rid of mucus in your nose (decongestant).  Avoiding the use of alcohol, medicines to help you relax, or certain pain medicines (narcotics).  Losing weight, if needed.  Changing your diet.  Not smoking.  Using a machine to open your airway while you sleep, such as: ? An oral appliance. This is a mouthpiece that shifts your lower jaw forward. ? A CPAP device. This device blows air through a mask when you breathe out (exhale). ? An EPAP device. This has valves that you put in each nostril. ? A BPAP device. This device blows air through a mask when you breathe in (inhale) and breathe out.  Having surgery if other treatments do not work. It is important to get treatment for sleep apnea. Without treatment, it can lead to:  High blood pressure.  Coronary artery disease.  In men, not being able to have an erection (impotence).  Reduced thinking ability. Follow these instructions at home: Lifestyle  Make changes that your doctor recommends.  Eat a healthy diet.  Lose weight if needed.  Avoid alcohol, medicines to help you relax, and some pain medicines.  Do not use any products that contain nicotine or tobacco, such as cigarettes, e-cigarettes, and chewing tobacco. If you need help quitting, ask your doctor. General instructions  Take over-the-counter and prescription medicines only as told by your doctor.  If you were given  a machine to use while you sleep, use it only as told by your doctor.  If you are having surgery, make sure to tell your doctor you have sleep apnea. You may need to bring your device with you.  Keep all follow-up visits as told by your doctor. This is important. Contact a doctor if:  The machine that you were given to use during sleep bothers you or does not seem to be working.  You do not get better.  You get worse. Get help right away if:  Your chest  hurts.  You have trouble breathing in enough air.  You have an uncomfortable feeling in your back, arms, or stomach.  You have trouble talking.  One side of your body feels weak.  A part of your face is hanging down. These symptoms may be an emergency. Do not wait to see if the symptoms will go away. Get medical help right away. Call your local emergency services (911 in the [place] Do not drive yourself to the hospital. Summary  This condition affects breathing during sleep.  The most common cause is a collapsed or blocked airway.  The goal of treatment is to help you breathe normally while you sleep. This information is not intended to replace advice given to you by your health care provider. Make sure you discuss any questions you have with your health care provider. Document Released: 01/21/2008 Document Revised: 01/28/2018 Document Reviewed: 12/07/2017 Elsevier Patient Education  2020 [employer].

## 2019-01-03 NOTE — Progress Notes (Signed)
PATIENT: Carl Taylor DOB: 06-12-1975  REASON FOR VISIT: follow up HISTORY FROM: patient  Chief Complaint  Patient presents with  . Follow-up    Rm 5, alone.  Has not used the cpap machine for the last 6 months.   . cpap     HISTORY OF PRESENT ILLNESS: Today 01/03/19 Carl Taylor is a 43 y.o. male here today for follow up for OSA on CPAP. He reports that the last date he used CPAP was 05/22/2018. He had a bad sinus infection and never restarted therapy. He received supplies recently but reports that he got the wrong mask. He felt that he should come talk to [place] before restarting. He is currently Airfit P30i. He was doing ok with this in the past but feels that he is suffocating at times. He would like to talk with DME about a new mask. He did not benefit when he was using CPAP. He is working on weight loss. He was told at his last CPE that he "was heading towards diabetes."  HISTORY: (copied from Dr Dohmeier's note on 07/07/2017)  HPI:  Carl Taylor is a 43 y.o. male , seen here as in a referral  from Dr. Coletta Memos for a sleep evaluation,  I have the pleasure of seeing Carl Taylor today, a Caucasian, right-handed, married gentleman and father of 2, who reports difficulties to sleep through the night. Chief complaint according to patient : "I am unable to sleep with my wife watching TV in the bedroom until midnight ".   He carries a diagnosis of obsessive-compulsive disorder, well-controlled on SSRI medication, he has had weight gain issues for the last 4 years or longer, he developed a fatty liver.He reports difficulties with daytime fatigue, has 2-3 times nocturia, and his wife observed him snoring. He has trouble to go to sleep, needs a very cool, quiet and dark bedroom. He works shifts as a Quarry manager, almost 8 hours a day sitting in front of a computer, but often late shifts .  Sleep habits are as follows:The patient goes to bed at 11 pm and does not sleep immediately, he  needs until midnight - and he wakes up at 2 and 4 AM. His wife has to give the baby medicine at midnight - and stays up- watching TV in the bedroom. He has tried to sleep in a recliner downstairs. He prefers a fan in the bedroom. He got a contour pillow, uses only one. He is a back sleeper since he gained weight and has back pain. His snoring is loud. Once he is asleep after that, he is difficult to arouse.  He has nocturnal headaches- throbbing , waking him. He wakes up with throbbing headaches , too.  He rememberes difficulties to get up in AM for school. He is at work at 8.30 AM, always late . He reports he  feels all over achy and fatigue. The first step out of bed is horrible - joint pain and stiffness. His headaches resolve after the first caffeine of the day.   Sleep medical history and family sleep history: sleep talking , possible sleep walking. He does not remember the long conversations at night - but his wife and children do. DDD. 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 Chronic fatigue . No tonsillectomy in childhood.  ADD/ OCD.  Septoplasty 12 yaers ago.  His Baby daughter was born with atresia of vagina, rectum and urethra. At 52 month age she developed ADEM. Steroid treatment. Developmentally delayed, hemiparetic  on the left side. 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 She has brain demyelination.  She is now 44 years old.  Their son is healthy but had ADD at age 44. Social history:  Married, 2 children, one with special needs,  tobacco use - chews tobacco. ETOH none, Caffeine : lots of caffeine . Coffee or soda throughout the day, 7-8 a day. 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   I have the pleasure of following up with Carl Taylor today on 07 July 2017.  Carl Taylor underwent a baseline polysomnography on 12 February 2017 which revealed a mild form of sleep apnea that was exacerbated in supine sleep position.   While his sleep apnea was mild it was associated with prolonged hypoxemia a total of 54 minutes of sleep time were spent below 89% oxygen saturation.  He also retained CO2.  He had frequent limb movements and moderate frequently of limb movement related arousals.  He took 2 bathroom breaks which is above average.  Loud snoring was noted when in supine.  Based on this I asked asked the patient to return for a CPAP titration and hope that also the periodic limb movements would improve once he is on CPAP.  His supine AHI was 31.2, his overall AHI was 11.5.  Based on this distribution and in light of the hypoxemia he was considered a CPAP candidate and not a dental device failure.  He return for CPAP titration on 02 April 2017, AHI index was 0.0 once a pressure of 7 cm water was reached, he still had some periods of low oxygen but the total time and oxygen desaturation was very reduced to 8 minutes, sleep efficiency was 86%.  I recommended to start CPAP at 7 cmH2O.  He was [facility] and has used the CPAP 30 out of 30 days.  I would like to add that the patient does have called at night that some nights will be cut short by this is compliance for over 4 hours of consecutive use is 70% and his average usage is 5 hours and 3 minutes he was a 7 cm water pressure with a residual AHI of 3.3 95th percentile air leak is a little higher, no central apneas have emerged, CPAP 95th percentile pressure likely needs to be elevated by 1 cm to 8 cm water, EPR reduced to 2 cm water.  He likes to sleep on his back>    REVIEW OF SYSTEMS: Out of a complete 14 system review of symptoms, the patient complains only of the following symptoms, none and all other reviewed systems are negative.  ALLERGIES: Allergies  Allergen Reactions  . Bee Venom Other (See Comments)    HOME MEDICATIONS: Outpatient Medications Prior to Visit  Medication Sig Dispense Refill  . cetirizine (ZYRTEC) 10  MG tablet Take 20 mg by mouth.    . clotrimazole (LOTRIMIN AF) 1 % cream Apply perianally twice daily x 1 week 30 g 0  . DEXILANT 60 MG capsule     . fluticasone (FLONASE) 50 MCG/ACT nasal spray     . PARoxetine (PAXIL) 20 MG tablet Take 20 mg by mouth.     No facility-administered medications prior to visit.     PAST MEDICAL HISTORY: Past Medical History:  Diagnosis Date  . Allergic rhinitis   . Anal fissure   . Anxiety   . GERD (gastroesophageal reflux  disease)   . Hearing loss   . Hypertension   . OCD (obsessive compulsive disorder)     PAST SURGICAL HISTORY: Past Surgical History:  Procedure Laterality Date  . LASIK    . NASAL SEPTUM SURGERY    . VASECTOMY      FAMILY HISTORY: Family History  Problem Relation Age of Onset  . Hypertension Paternal Grandfather   . Diabetes Paternal Grandfather   . Autoimmune disease Daughter     SOCIAL HISTORY: Social History   Socioeconomic History  . Marital status: Married    Spouse name: Not on file  . Number of children: 2  . Years of education: Not on file  . Highest education level: Not on file  Occupational History  . Not on file  Social Needs  . Financial resource strain: Not on file  . Food insecurity    Worry: Not on file    Inability: Not on file  . Transportation needs    Medical: Not on file    Non-medical: Not on file  Tobacco Use  . Smoking status: Never Smoker  . Smokeless tobacco: Current User    Types: Chew  Substance and Sexual Activity  . Alcohol use: No  . Drug use: No  . Sexual activity: Not on file  Lifestyle  . Physical activity    Days per week: Not on file    Minutes per session: Not on file  . Stress: Not on file  Relationships  . Social Musician on phone: Not on file    Gets together: Not on file    Attends religious service: Not on file    Active member of club or organization: Not on file    Attends meetings of clubs or organizations: Not on file    Relationship  status: Not on file  . Intimate partner violence    Fear of current or ex partner: Not on file    Emotionally abused: Not on file    Physically abused: Not on file    Forced sexual activity: Not on file  Other Topics Concern  . Not on file  Social History Narrative  . Not on file      PHYSICAL EXAM  Vitals:   01/03/19 1457  BP: 120/78  Pulse: 60  Temp: 97.7 F (36.5 C)  SpO2: 98%  Weight: 285 lb (129.3 kg)  Height: 5\' 10"  (1.778 m)   Body mass index is 40.89 kg/m.  Generalized: Well developed, in no acute distress  Mallampati: 3+, neck circ 17.25" Neurological examination  Mentation: Alert oriented to time, place, history taking. Follows all commands speech and language fluent Cranial nerve II-XII: Pupils were equal round reactive to light. Extraocular movements were full, visual field were full on confrontational test. Facial sensation and strength were normal. Uvula tongue midline. Head turning and shoulder shrug  were normal and symmetric. Motor: The motor testing reveals 5 over 5 strength of all 4 extremities. Good symmetric motor tone is noted throughout.  Sensory: Sensory testing is intact to soft touch on all 4 extremities. No evidence of extinction is noted.  Coordination: Cerebellar testing reveals good finger-nose-finger and heel-to-shin bilaterally.  Gait and station: Gait is normal.    DIAGNOSTIC DATA (LABS, IMAGING, TESTING) - I reviewed patient records, labs, notes, testing and imaging myself where available.  No flowsheet data found.   No results found for: WBC, HGB, HCT, MCV, PLT No results found for: NA, K, CL, CO2, GLUCOSE, BUN,  CREATININE, CALCIUM, PROT, ALBUMIN, AST, ALT, ALKPHOS, BILITOT, GFRNONAA, GFRAA No results found for: CHOL, HDL, LDLCALC, LDLDIRECT, TRIG, CHOLHDL No results found for: ZOXW9UHGBA1C No results found for: VITAMINB12 No results found for: TSH    ASSESSMENT AND PLAN 43 y.o. year old male  has a past medical history of Allergic  rhinitis, Anal fissure, Anxiety, GERD (gastroesophageal reflux disease), Hearing loss, Hypertension, and OCD (obsessive compulsive disorder). here with     ICD-10-CM   1. OSA on CPAP  G47.33 For home use only DME continuous positive airway pressure (CPAP)   Z99.89      He is willing to restart CPAP therapy. We have discussed risks of untreated sleep apnea. Orders placed to DME for mask refitting. I have encouraged him to use CPAP nightly and for greater than 4 hours each night. I have also encouraged continued weight loss efforts. I offered referral to [facility] but he is not interested at this time. He will follow up with me in 3 months, sooner if needed.     Orders Placed This Encounter  Procedures  . For home use only DME continuous positive airway pressure (CPAP)    Mask refitting.    Order Specific Question:   Length of Need    Answer:   Lifetime    Order Specific Question:   Patient has OSA or probable OSA    Answer:   Yes    Order Specific Question:   Is the patient currently using CPAP in the home    Answer:   Yes    Order Specific Question:   Settings    Answer:   Other see comments    Order Specific Question:   CPAP supplies needed    Answer:   Mask, headgear, cushions, filters, heated tubing and water chamber     No orders of the defined types were placed in this encounter.     I spent 15 minutes with the patient. 50% of this time was spent counseling and educating patient on plan of care and medications.    Shawnie Dappermy Taisia Fantini, FNP-C 01/03/2019, 4:09 PM [facility] [address], [address] [place] [id number] [phone]

## 2019-01-18 NOTE — Telephone Encounter (Signed)
[facility] did received orders.

## 2019-01-18 NOTE — Telephone Encounter (Signed)
I sent message to [facility] re: new cpap orders from 01-03-19.

## 2019-04-04 ENCOUNTER — Telehealth: Payer: Self-pay

## 2019-04-04 ENCOUNTER — Telehealth: Payer: Self-pay | Admitting: Family Medicine

## 2019-04-04 NOTE — Telephone Encounter (Signed)
LVM asking the patient to call our office to r/s his appt due Debbora Presto, NP being out of the office today. Office number was provided.

## 2020-03-26 ENCOUNTER — Telehealth (HOSPITAL_COMMUNITY): Payer: Self-pay | Admitting: Emergency Medicine

## 2020-03-26 ENCOUNTER — Other Ambulatory Visit: Payer: Self-pay | Admitting: Nurse Practitioner

## 2020-03-26 ENCOUNTER — Telehealth: Payer: Self-pay | Admitting: Nurse Practitioner

## 2020-03-26 ENCOUNTER — Telehealth (HOSPITAL_COMMUNITY): Payer: Self-pay

## 2020-03-26 ENCOUNTER — Encounter: Payer: Self-pay | Admitting: Nurse Practitioner

## 2020-03-26 DIAGNOSIS — U071 COVID-19: Secondary | ICD-10-CM

## 2020-03-26 NOTE — Telephone Encounter (Signed)
Left message regarding monoclonal antibody treatment for COVID-19 and call back number [phone].  

## 2020-03-26 NOTE — Telephone Encounter (Signed)
Called to Discuss with patient about Covid symptoms and the use of the monoclonal antibody infusion for those with mild to moderate Covid symptoms and at a high risk of hospitalization.     Pt appears to qualify for this infusion due to co-morbid conditions and/or a member of an at-risk group in accordance with the FDA Emergency Use Authorization.    Pt stated sx started 11/25, tested positive through [facility] on 11/29, and states symptoms include chills, fever, cough, and no taste or smell. Pt qualifies based on BMI>25. Pt informed that an APP will follow up to verify information and if qualifies will set up and appointment for himself and wife.

## 2020-03-26 NOTE — Telephone Encounter (Signed)
Called to Discuss with patient about Covid symptoms and the use of a monoclonal antibody infusion for those with mild to moderate Covid symptoms and at a high risk of hospitalization.     Pt is qualified for this infusion at the  infusion center due to co-morbid conditions and/or a member of an at-risk group.     Unable to reach pt. Left message to return call.   Tonnya Garbett, DNP, AGNP-C [phone] (Infusion Center Hotline)  

## 2020-03-26 NOTE — Progress Notes (Signed)
I connected by phone with Carl Taylor on 03/26/2020 at 8:37 PM to discuss the potential use of a treatment for mild to moderate COVID-19 viral infection in non-hospitalized patients.  This patient is a 44 y.o. male that meets the FDA criteria for Emergency Use Authorization of bamlanivimab/etesevimab, casirivimab\imdevimab, or sotrovimab  Has a (+) direct SARS-CoV-2 viral test result  Has mild or moderate COVID-19   Is ? 45 years of age and weighs ? 40 kg  Is NOT hospitalized due to COVID-19  Is NOT requiring oxygen therapy or requiring an increase in baseline oxygen flow rate due to COVID-19  Is within 10 days of symptom onset  Has at least one of the high risk factor(s) for progression to severe COVID-19 and/or hospitalization as defined in EUA.  Specific high risk criteria : BMI > 25   I have spoken and communicated the following to the patient or parent/caregiver:  1. FDA has authorized the emergency use of bamlanivimab/etesevimab, casirivimab\imdevimab, or sotrovimab for the treatment of mild to moderate COVID-19 in adults and pediatric patients with positive results of direct SARS-CoV-2 viral testing who are 37 years of age and older weighing at least 40 kg, and who are at high risk for progressing to severe COVID-19 and/or hospitalization.  2. The significant known and potential risks and benefits of bamlanivimab/etesevimab, casirivimab\imdevimab, or sotrovimab, and the extent to which such potential risks and benefits are unknown.  3. Information on available alternative treatments and the risks and benefits of those alternatives, including clinical trials.  4. Patients treated with bamlanivimab/etesevimab, casirivimab\imdevimab, or sotrovimab should continue to self-isolate and use infection control measures (e.g., wear mask, isolate, social distance, avoid sharing personal items, clean and disinfect "high touch" surfaces, and frequent handwashing) according to CDC  guidelines.   5. The patient or parent/caregiver has the option to accept or refuse bamlanivimab/etesevimab, casirivimab\imdevimab, or sotrovimab.  After reviewing this information with the patient, the patient has agreed to receive one of the available covid 19 monoclonal antibodies and will be provided an appropriate fact sheet prior to infusion.Consuello Masse, DNP, AGNP-C [phone] (Infusion Center Hotline)

## 2020-03-28 ENCOUNTER — Other Ambulatory Visit (HOSPITAL_COMMUNITY): Payer: Self-pay

## 2020-03-28 ENCOUNTER — Ambulatory Visit (HOSPITAL_COMMUNITY)
Admission: RE | Admit: 2020-03-28 | Discharge: 2020-03-28 | Disposition: A | Discharge status: Home / Self Care | Payer: 59 | Source: Ambulatory Visit | Attending: Pulmonary Disease | Admitting: Pulmonary Disease

## 2020-03-28 DIAGNOSIS — Z6825 Body mass index (BMI) 25.0-25.9, adult: Secondary | ICD-10-CM | POA: Insufficient documentation

## 2020-03-28 DIAGNOSIS — U071 COVID-19: Secondary | ICD-10-CM | POA: Diagnosis present

## 2020-03-28 MED ORDER — METHYLPREDNISOLONE SODIUM SUCC 125 MG IJ SOLR: 125.0000 mg | Freq: Once | INTRAMUSCULAR | Status: DC | PRN

## 2020-03-28 MED ORDER — FAMOTIDINE IN NACL 20-0.9 MG/50ML-% IV SOLN: 20.0000 mg | Freq: Once | INTRAVENOUS | Status: DC | PRN

## 2020-03-28 MED ORDER — ALBUTEROL SULFATE HFA 108 (90 BASE) MCG/ACT IN AERS: 2.0000 | INHALATION_SPRAY | Freq: Once | RESPIRATORY_TRACT | Status: DC | PRN

## 2020-03-28 MED ORDER — EPINEPHRINE 0.3 MG/0.3ML IJ SOAJ: 0.3000 mg | Freq: Once | INTRAMUSCULAR | Status: DC | PRN

## 2020-03-28 MED ORDER — DIPHENHYDRAMINE HCL 50 MG/ML IJ SOLN: 50.0000 mg | Freq: Once | INTRAMUSCULAR | Status: DC | PRN

## 2020-03-28 MED ORDER — SODIUM CHLORIDE 0.9 % IV SOLN: INTRAVENOUS | Status: DC | PRN

## 2020-03-28 MED ORDER — SOTROVIMAB 500 MG/8ML IV SOLN
500.0000 mg | Freq: Once | INTRAVENOUS | Status: AC
  Administered 2020-03-28: 500 mg via INTRAVENOUS

## 2020-03-28 NOTE — Progress Notes (Signed)
Patient reviewed Fact Sheet for Patients, Parents, and Caregivers for Emergency Use Authorization (EUA) of Sotrovimab for the Treatment of Coronavirus. Patient also reviewed and is agreeable to the estimated cost of treatment. Patient is agreeable to proceed.   

## 2020-03-28 NOTE — Progress Notes (Signed)
Diagnosis: COVID-19  Physician: Dr. Patrick Wright  Procedure: Covid Infusion Clinic Med: Sotrovimab infusion - Provided patient with sotrovimab fact sheet for patients, parents, and caregivers prior to infusion.   Complications: No immediate complications noted  Discharge: Discharged home    

## 2020-03-28 NOTE — Discharge Instructions (Signed)
10 Things You Can Do to Manage Your COVID-19 Symptoms at Home If you have possible or confirmed COVID-19: 1. Stay home from work and school. And stay away from other public places. If you must go out, avoid using any kind of public transportation, ridesharing, or taxis. 2. Monitor your symptoms carefully. If your symptoms get worse, call your healthcare provider immediately. 3. Get rest and stay hydrated. 4. If you have a medical appointment, call the healthcare provider ahead of time and tell them that you have or may have COVID-19. 5. For medical emergencies, call 911 and notify the dispatch personnel that you have or may have COVID-19. 6. Cover your cough and sneezes with a tissue or use the inside of your elbow. 7. Wash your hands often with soap and water for at least 20 seconds or clean your hands with an alcohol-based hand sanitizer that contains at least 60% alcohol. 8. As much as possible, stay in a specific room and away from other people in your home. Also, you should use a separate bathroom, if available. If you need to be around other people in or outside of the home, wear a mask. 9. Avoid sharing personal items with other people in your household, like dishes, towels, and bedding. 10. Clean all surfaces that are touched often, like counters, tabletops, and doorknobs. Use household cleaning sprays or wipes according to the label instructions. [employer] 10/26/2018 This information is not intended to replace advice given to you by your health care provider. Make sure you discuss any questions you have with your health care provider. Document Revised: 03/30/2019 Document Reviewed: 03/30/2019 Elsevier Patient Education  2020 [employer]. What types of side effects do monoclonal antibody drugs cause?  Common side effects  In general, the more common side effects caused by monoclonal antibody drugs include: . Allergic reactions, such as hives or itching . Flu-like signs and  symptoms, including chills, fatigue, fever, and muscle aches and pains . Nausea, vomiting . Diarrhea . Skin rashes . Low blood pressure   The CDC is recommending patients who receive monoclonal antibody treatments wait at least 90 days before being vaccinated.  Currently, there are no data on the safety and efficacy of mRNA COVID-19 vaccines in persons who received monoclonal antibodies or convalescent plasma as part of COVID-19 treatment. Based on the estimated half-life of such therapies as well as evidence suggesting that reinfection is uncommon in the 90 days after initial infection, vaccination should be deferred for at least 90 days, as a precautionary measure until additional information becomes available, to avoid interference of the antibody treatment with vaccine-induced immune response  If you have any questions or concerns after the infusion please call the Advanced Practice Provider on call at [phone]. This number is ONLY intended for your use regarding questions or concerns about the infusion post-treatment side-effects.  Please do not provide this number to others for use. For return to work notes please contact your primary care provider.   If someone you know is interested in receiving treatment please have them call the COVID hotline at [phone].

## 2020-09-09 ENCOUNTER — Encounter (INDEPENDENT_AMBULATORY_CARE_PROVIDER_SITE_OTHER): Payer: Self-pay

## 2021-04-06 ENCOUNTER — Other Ambulatory Visit: Payer: Self-pay

## 2021-04-06 ENCOUNTER — Encounter (HOSPITAL_BASED_OUTPATIENT_CLINIC_OR_DEPARTMENT_OTHER): Payer: Self-pay

## 2021-04-06 ENCOUNTER — Emergency Department (HOSPITAL_BASED_OUTPATIENT_CLINIC_OR_DEPARTMENT_OTHER): Payer: 59

## 2021-04-06 ENCOUNTER — Emergency Department (HOSPITAL_BASED_OUTPATIENT_CLINIC_OR_DEPARTMENT_OTHER)
Admission: EM | Admit: 2021-04-06 | Discharge: 2021-04-06 | Disposition: A | Discharge status: Home / Self Care | Payer: 59 | Attending: Emergency Medicine | Admitting: Emergency Medicine

## 2021-04-06 DIAGNOSIS — I10 Essential (primary) hypertension: Secondary | ICD-10-CM | POA: Insufficient documentation

## 2021-04-06 DIAGNOSIS — X58XXXA Exposure to other specified factors, initial encounter: Secondary | ICD-10-CM | POA: Diagnosis not present

## 2021-04-06 DIAGNOSIS — S0990XA Unspecified injury of head, initial encounter: Secondary | ICD-10-CM | POA: Diagnosis not present

## 2021-04-06 DIAGNOSIS — Y92481 Parking lot as the place of occurrence of the external cause: Secondary | ICD-10-CM | POA: Insufficient documentation

## 2021-04-06 DIAGNOSIS — R55 Syncope and collapse: Secondary | ICD-10-CM

## 2021-04-06 LAB — CBC WITH DIFFERENTIAL/PLATELET
Abs Immature Granulocytes: 0.02 10*3/uL (ref 0.00–0.07)
Basophils Absolute: 0 10*3/uL (ref 0.0–0.1)
Basophils Relative: 0 %
Eosinophils Absolute: 0.2 10*3/uL (ref 0.0–0.5)
Eosinophils Relative: 2 %
HCT: 42.7 % (ref 39.0–52.0)
Hemoglobin: 13.6 g/dL (ref 13.0–17.0)
Immature Granulocytes: 0 %
Lymphocytes Relative: 21 %
Lymphs Abs: 1.6 10*3/uL (ref 0.7–4.0)
MCH: 24.7 pg — ABNORMAL LOW (ref 26.0–34.0)
MCHC: 31.9 g/dL (ref 30.0–36.0)
MCV: 77.5 fL — ABNORMAL LOW (ref 80.0–100.0)
Monocytes Absolute: 0.6 10*3/uL (ref 0.1–1.0)
Monocytes Relative: 8 %
Neutro Abs: 5.2 10*3/uL (ref 1.7–7.7)
Neutrophils Relative %: 69 %
Platelets: 283 10*3/uL (ref 150–400)
RBC: 5.51 MIL/uL (ref 4.22–5.81)
RDW: 14.6 % (ref 11.5–15.5)
WBC: 7.6 10*3/uL (ref 4.0–10.5)
nRBC: 0 % (ref 0.0–0.2)

## 2021-04-06 LAB — TROPONIN I (HIGH SENSITIVITY): Troponin I (High Sensitivity): 8 ng/L (ref <18)

## 2021-04-06 LAB — COMPREHENSIVE METABOLIC PANEL
ALT: 28 U/L (ref 0–44)
AST: 25 U/L (ref 15–41)
Albumin: 3.9 g/dL (ref 3.5–5.0)
Alkaline Phosphatase: 64 U/L (ref 38–126)
Anion gap: 9 (ref 5–15)
BUN: 13 mg/dL (ref 6–20)
CO2: 25 mmol/L (ref 22–32)
Calcium: 9.3 mg/dL (ref 8.9–10.3)
Chloride: 103 mmol/L (ref 98–111)
Creatinine, Ser: 0.92 mg/dL (ref 0.61–1.24)
GFR, Estimated: >60 mL/min (ref >60)
Glucose, Bld: 112 mg/dL — ABNORMAL HIGH (ref 70–99)
Potassium: 4.1 mmol/L (ref 3.5–5.1)
Sodium: 137 mmol/L (ref 135–145)
Total Bilirubin: 0.3 mg/dL (ref 0.3–1.2)
Total Protein: 7 g/dL (ref 6.5–8.1)

## 2021-04-06 LAB — MAGNESIUM: Magnesium: 1.9 mg/dL (ref 1.7–2.4)

## 2021-04-06 MED ORDER — SODIUM CHLORIDE 0.9 % IV BOLUS
500.0000 mL | Freq: Once | INTRAVENOUS | Status: AC
  Administered 2021-04-06: 500 mL via INTRAVENOUS

## 2021-04-06 MED ORDER — KETOROLAC TROMETHAMINE 30 MG/ML IJ SOLN
30.0000 mg | Freq: Once | INTRAMUSCULAR | Status: AC
  Administered 2021-04-06: 30 mg via INTRAVENOUS
  Filled 2021-04-06: qty 1

## 2021-04-06 NOTE — Discharge Instructions (Addendum)
You have been seen today in the Emergency Department (ED)  for syncope (passing out).  Your workup including labs and EKG show reassuring results.   ? ?Please call your regular doctor as soon as possible to schedule the next available clinic appointment to follow up with him/her regarding your visit to the ED and your symptoms.  Return to the Emergency Department (ED)  if you have any further syncopal episodes (pass out again) or develop ANY chest pain, pressure, tightness, trouble breathing, sudden sweating, or other symptoms that concern you. ? ?

## 2021-04-06 NOTE — ED Notes (Signed)
Patient discharged to home.  All discharge instructions reviewed.  Patient verbalized understanding via teachback method.  VS WDL.  Respirations even and unlabored.  Ambulatory out of ED.   °

## 2021-04-06 NOTE — ED Triage Notes (Signed)
Pt was at [employer], per family was laughing, went unconscious, hit head, family reports looked like a seizure, pt has no recollection of event.  Pt was quickly aware of surroundings.  Has had hx of same a couple years ago.

## 2021-04-06 NOTE — ED Provider Notes (Signed)
Emergency Department Provider Note   I have reviewed the triage vital signs and the nursing notes.   HISTORY  Chief Complaint Near Syncope (Fall, LOC)   HPI Carl Taylor is a 45 y.o. male with past medical history reviewed below presents to the emergency department after syncope event.  The event was witnessed by his wife and son who are at bedside.  Patient states that an episode like this has happened to him in the past but today he was in the parking lot getting into his truck with his family.  He states that he passed gas very loudly which was very funny to him.  He began laughing very hard along with his family.  Patient states that he was laughing so hard it was difficult for him to get a complete inhalation and next thing he knew he woke up on the ground with his family standing over him.  The wife states that she was laughing but noticed that he was not getting in the truck and so went around to the other side of the vehicle and saw him lying unconscious on the ground.  She states this lasted for several seconds but that during that time he seemed to have stiffened extremities and jaw tightness.  There was no urine incontinence or tongue biting.  After several seconds the patient awoke and was momentarily confused but then after only a few seconds he returned back to his mental status baseline.  Patient tells me that an episode just like this happen several years ago where he was laughing very hard and then passed out when with friends on a camping trip.  He denies any chest pain or heart palpitations.  He states now he is feeling well other than a knot on the back of his head.   Past Medical History:  Diagnosis Date   Allergic rhinitis    Anal fissure    Anxiety    GERD (gastroesophageal reflux disease)    Hearing loss    Hypertension    OCD (obsessive compulsive disorder)     Patient Active Problem List   Diagnosis Date Noted   Sleep walking and eating 12/23/2016    Class 3 severe obesity due to excess calories without serious comorbidity with body mass index (BMI) of 40.0 to 44.9 in adult [facility]) 12/23/2016   Sleep talking 12/23/2016   Snoring 12/23/2016    Past Surgical History:  Procedure Laterality Date   LASIK     NASAL SEPTUM SURGERY     VASECTOMY      Allergies Bee venom  Family History  Problem Relation Age of Onset   Hypertension Paternal Grandfather    Diabetes Paternal Grandfather    Autoimmune disease Daughter     Social History Social History   Tobacco Use   Smoking status: Never   Smokeless tobacco: Current    Types: Chew  Vaping Use   Vaping Use: Never used  Substance Use Topics   Alcohol use: No   Drug use: No    Review of Systems  Constitutional: No fever/chills Eyes: No visual changes. ENT: No sore throat. Cardiovascular: Denies chest pain. Positive syncope.  Respiratory: Denies shortness of breath. Gastrointestinal: No abdominal pain.  No nausea, no vomiting.  No diarrhea.  No constipation. Genitourinary: Negative for dysuria. Musculoskeletal: Negative for back pain. Skin: Negative for rash. Neurological: Negative for focal weakness or numbness. Positive HA.   10-point ROS otherwise negative.  ____________________________________________   PHYSICAL EXAM:  VITAL  SIGNS: ED Triage Vitals  Enc Vitals Group     BP 04/06/21 1651 (!) 130/91     Pulse Rate 04/06/21 1651 72     Resp 04/06/21 1651 18     Temp 04/06/21 1651 98.2 F (36.8 C)     Temp Source 04/06/21 1651 Oral     SpO2 04/06/21 1651 98 %     Weight 04/06/21 1650 285 lb (129.3 kg)     Height 04/06/21 1650 5\' 10"  (1.778 m)   Constitutional: Alert and oriented. Well appearing and in no acute distress. Eyes: Conjunctivae are normal. PERRL. EOMI. Head: Hematoma to the occipital scalp with abrasion but no laceration.  Nose: No congestion/rhinnorhea. Mouth/Throat: Mucous membranes are moist.  Neck: No stridor.  No cervical spine tenderness  to palpation. Cardiovascular: Normal rate, regular rhythm. Good peripheral circulation. Grossly normal heart sounds.   Respiratory: Normal respiratory effort.  No retractions. Lungs CTAB. Gastrointestinal: Soft and nontender. No distention.  Musculoskeletal: No lower extremity tenderness nor edema. No gross deformities of extremities. Neurologic:  Normal speech and language. No gross focal neurologic deficits are appreciated.  No facial asymmetry.  Normal sensation to the face, arms, legs.  5/5 strength in the bilateral upper and lower extremities with normal coordination.  Skin:  Skin is warm, dry and intact. No rash noted.   ____________________________________________   LABS (all labs ordered are listed, but only abnormal results are displayed)  Labs Reviewed  COMPREHENSIVE METABOLIC PANEL - Abnormal; Notable for the following components:      Result Value   Glucose, Bld 112 (*)    All other components within normal limits  CBC WITH DIFFERENTIAL/PLATELET - Abnormal; Notable for the following components:   MCV 77.5 (*)    MCH 24.7 (*)    All other components within normal limits  MAGNESIUM  TROPONIN I (HIGH SENSITIVITY)   ____________________________________________  EKG   EKG Interpretation  Date/Time:  Sunday April 06 2021 17:02:33 EST Ventricular Rate:  70 PR Interval:  160 QRS Duration: 109 QT Interval:  373 QTC Calculation: 403 R Axis:   19 Text Interpretation: Sinus rhythm Low voltage, precordial leads Abnormal R-wave progression, early transition Confirmed by 11[birth year]-1980 [phone]) on 04/06/2021 5:04:42 PM        ____________________________________________  RADIOLOGY  CT head reviewed without acute findings.   ____________________________________________   PROCEDURES  Procedure(s) performed:   Procedures  None ____________________________________________   INITIAL IMPRESSION / ASSESSMENT AND PLAN / ED COURSE  Pertinent labs & imaging  results that were available during my care of the patient were reviewed by me and considered in my medical decision making (see chart for details).   Patient presents emergency department for evaluation after what sounds like an episode of syncope precipitated by very forceful laughing.  The wife does describe some tonic-clonic activity although he has no stigmata of seizure and was not postictal.  The episode lasted only for several seconds.  Suspect some tonic-clonic activity related to syncope.  An episode like this has happened in the past.  Patient's EKG interpreted as above.  Will obtain CT imaging of the head with some hematoma to the back of the scalp and abrasion to rule out traumatic injury/bleed/fracture.  Will obtain screening lab work including troponin.  Patient may benefit from cardiology referral and follow-up for syncope.  No acute findings on labs. Electrolytes are WNL. Troponin negative. No acute findings on CT head. Plan for Cardiology referral and PCP follow up.  ____________________________________________  FINAL  CLINICAL IMPRESSION(S) / ED DIAGNOSES  Final diagnoses:  Syncope and collapse  Injury of head, initial encounter     MEDICATIONS GIVEN DURING THIS VISIT:  Medications  sodium chloride 0.9 % bolus 500 mL (0 mLs Intravenous Stopped 04/06/21 1901)  ketorolac (TORADOL) 30 MG/ML injection 30 mg (30 mg Intravenous Given 04/06/21 1818)    Note:  This document was prepared using Dragon voice recognition software and may include unintentional dictation errors.  Alona Bene, MD, [facility] Emergency Medicine    Nissan Frazzini, Arlyss Repress, MD 04/10/21 [phone]

## 2021-04-06 NOTE — ED Notes (Signed)
Patient transported to CT 

## 2021-10-29 ENCOUNTER — Encounter (HOSPITAL_BASED_OUTPATIENT_CLINIC_OR_DEPARTMENT_OTHER): Payer: Self-pay

## 2021-10-29 ENCOUNTER — Other Ambulatory Visit: Payer: Self-pay

## 2021-10-29 ENCOUNTER — Emergency Department (HOSPITAL_BASED_OUTPATIENT_CLINIC_OR_DEPARTMENT_OTHER)
Admission: EM | Admit: 2021-10-29 | Discharge: 2021-10-29 | Disposition: A | Discharge status: Home / Self Care | Payer: 59 | Attending: Emergency Medicine | Admitting: Emergency Medicine

## 2021-10-29 ENCOUNTER — Emergency Department (HOSPITAL_BASED_OUTPATIENT_CLINIC_OR_DEPARTMENT_OTHER): Payer: 59

## 2021-10-29 DIAGNOSIS — D649 Anemia, unspecified: Secondary | ICD-10-CM | POA: Insufficient documentation

## 2021-10-29 DIAGNOSIS — R7401 Elevation of levels of liver transaminase levels: Secondary | ICD-10-CM | POA: Insufficient documentation

## 2021-10-29 DIAGNOSIS — R109 Unspecified abdominal pain: Secondary | ICD-10-CM | POA: Diagnosis present

## 2021-10-29 DIAGNOSIS — K529 Noninfective gastroenteritis and colitis, unspecified: Secondary | ICD-10-CM | POA: Insufficient documentation

## 2021-10-29 LAB — CBC
HCT: 39.7 % (ref 39.0–52.0)
Hemoglobin: 12.6 g/dL — ABNORMAL LOW (ref 13.0–17.0)
MCH: 23.7 pg — ABNORMAL LOW (ref 26.0–34.0)
MCHC: 31.7 g/dL (ref 30.0–36.0)
MCV: 74.8 fL — ABNORMAL LOW (ref 80.0–100.0)
Platelets: 327 10*3/uL (ref 150–400)
RBC: 5.31 MIL/uL (ref 4.22–5.81)
RDW: 15.6 % — ABNORMAL HIGH (ref 11.5–15.5)
WBC: 9.4 10*3/uL (ref 4.0–10.5)
nRBC: 0 % (ref 0.0–0.2)

## 2021-10-29 LAB — COMPREHENSIVE METABOLIC PANEL
ALT: 47 U/L — ABNORMAL HIGH (ref 0–44)
AST: 49 U/L — ABNORMAL HIGH (ref 15–41)
Albumin: 3.9 g/dL (ref 3.5–5.0)
Alkaline Phosphatase: 63 U/L (ref 38–126)
Anion gap: 8 (ref 5–15)
BUN: 18 mg/dL (ref 6–20)
CO2: 23 mmol/L (ref 22–32)
Calcium: 8.8 mg/dL — ABNORMAL LOW (ref 8.9–10.3)
Chloride: 105 mmol/L (ref 98–111)
Creatinine, Ser: 0.96 mg/dL (ref 0.61–1.24)
GFR, Estimated: >60 mL/min (ref >60)
Glucose, Bld: 114 mg/dL — ABNORMAL HIGH (ref 70–99)
Potassium: 3.9 mmol/L (ref 3.5–5.1)
Sodium: 136 mmol/L (ref 135–145)
Total Bilirubin: 0.8 mg/dL (ref 0.3–1.2)
Total Protein: 7.3 g/dL (ref 6.5–8.1)

## 2021-10-29 LAB — URINALYSIS, ROUTINE W REFLEX MICROSCOPIC
Bilirubin Urine: NEGATIVE
Glucose, UA: NEGATIVE mg/dL
Hgb urine dipstick: NEGATIVE
Ketones, ur: NEGATIVE mg/dL
Leukocytes,Ua: NEGATIVE
Nitrite: NEGATIVE
Protein, ur: NEGATIVE mg/dL
Specific Gravity, Urine: 1.025 (ref 1.005–1.030)
pH: 5 (ref 5.0–8.0)

## 2021-10-29 LAB — LIPASE, BLOOD: Lipase: 27 U/L (ref 11–51)

## 2021-10-29 MED ORDER — IOHEXOL 300 MG/ML  SOLN
100.0000 mL | Freq: Once | INTRAMUSCULAR | Status: AC | PRN
  Administered 2021-10-29: 100 mL via INTRAVENOUS

## 2021-10-29 MED ORDER — METRONIDAZOLE 500 MG PO TABS
500.0000 mg | ORAL_TABLET | Freq: Once | ORAL | Status: AC
  Administered 2021-10-29: 500 mg via ORAL
  Filled 2021-10-29: qty 1

## 2021-10-29 MED ORDER — METRONIDAZOLE 500 MG PO TABS: 500.0000 mg | ORAL_TABLET | Freq: Three times a day (TID) | ORAL | 0 refills | Status: DC

## 2021-10-29 MED ORDER — CIPROFLOXACIN HCL 500 MG PO TABS: 500.0000 mg | ORAL_TABLET | Freq: Two times a day (BID) | ORAL | 0 refills | Status: DC

## 2021-10-29 MED ORDER — CIPROFLOXACIN HCL 500 MG PO TABS
500.0000 mg | ORAL_TABLET | Freq: Once | ORAL | Status: AC
  Administered 2021-10-29: 500 mg via ORAL
  Filled 2021-10-29: qty 1

## 2021-10-29 NOTE — Discharge Instructions (Signed)
Please read and follow all provided instructions.  Your diagnoses today include:  1. Colitis     Tests performed today include: Blood cell counts and platelets: mildly low red blood cell count Kidney and liver function tests Pancreas function test (called lipase) Urine test to look for infection CT scan of the abdomen and pelvis: shows mild descending colitis Vital signs. See below for your results today.   Medications prescribed:  Ciprofloxacin - antibiotic  You have been prescribed an antibiotic medicine: take the entire course of medicine even if you are feeling better. Stopping early can cause the antibiotic not to work.  Metronidazole - antibiotic  You have been prescribed an antibiotic medicine: take the entire course of medicine even if you are feeling better. Stopping early can cause the antibiotic not to work. Do not drink alcohol when taking this medication.   Take any prescribed medications only as directed.  Home care instructions:  Follow any educational materials contained in this packet.  Follow-up instructions: Please follow-up with your primary care provider in the next 3-5 days for further evaluation of your symptoms.    Return instructions:  SEEK IMMEDIATE MEDICAL ATTENTION IF: The pain does not go away or becomes severe  A temperature above 101F develops  Repeated vomiting occurs (multiple episodes)  The pain becomes localized to portions of the abdomen. The right side could possibly be appendicitis. In an adult, the left lower portion of the abdomen could be colitis or diverticulitis.  Larger amounts of blood is being passed in stools or vomit (bright red or black tarry stools)  You develop chest pain, difficulty breathing, dizziness or fainting, or become confused, poorly responsive, or inconsolable (young children) If you have any other emergent concerns regarding your health  Additional Information: Abdominal (belly) pain can be caused by many things.  Your caregiver performed an examination and possibly ordered blood/urine tests and imaging (CT scan, x-rays, ultrasound). Many cases can be observed and treated at home after initial evaluation in the emergency department. Even though you are being discharged home, abdominal pain can be unpredictable. Therefore, you need a repeated exam if your pain does not resolve, returns, or worsens. Most patients with abdominal pain don't have to be admitted to the hospital or have surgery, but serious problems like appendicitis and gallbladder attacks can start out as nonspecific pain. Many abdominal conditions cannot be diagnosed in one visit, so follow-up evaluations are very important.  Your vital signs today were: BP 138/90   Pulse 62   Temp 99.4 F (37.4 C) (Oral)   Resp 16   Ht 5\' 10"  (1.778 m)   Wt 124.3 kg   SpO2 100%   BMI 39.31 kg/m  If your blood pressure (bp) was elevated above 135/85 this visit, please have this repeated by your doctor within one month. --------------

## 2021-10-29 NOTE — ED Triage Notes (Signed)
At 0430 woke up with stomach cramps, diarrhea. States he thinks he had syncopal episode while on toilet. Became diaphoretic. Now having bloody diarrhea.

## 2021-10-29 NOTE — ED Notes (Signed)
Patient transported to CT 

## 2021-10-29 NOTE — ED Provider Notes (Signed)
[facility] EMERGENCY DEPARTMENT Provider Note   CSN: [id number] Arrival date & time: 10/29/21  1829     History  Chief Complaint  Patient presents with   Abdominal Pain   Rectal Bleeding    Carl Taylor is a 46 y.o. male.  Patient with no past surgical history presents to the emergency department today for evaluation of abdominal discomfort and bloating, rectal bleeding.  Patient states that his symptoms started when he woke up this morning.  Patient had the urge to use the bathroom and had diarrhea.  During these episodes he had lightheadedness but did not fully pass out.  Since that time, he has not had a formed bowel movement but has had gas with bright red blood per rectum.  Abdomen is generally uncomfortable, worse left lower quadrant.  No associated fevers, chest pain, shortness of breath, vomiting.  He has had decreased appetite and nausea today.  No dysuria, increased frequency or urgency, hematuria. Is on Monjarou trial at [facility]. Took Miralax 2 days ago, he reports this medic patient typically makes him constipated.      Home Medications Prior to Admission medications   Medication Sig Start Date End Date Taking? Authorizing Provider  cetirizine (ZYRTEC) 10 MG tablet Take 20 mg by mouth.    [provider]  clotrimazole (LOTRIMIN AF) 1 % cream Apply perianally twice daily x 1 week 01/15/17   Meryl Dare, MD  DEXILANT 60 MG capsule  12/16/16   [provider]  fluticasone Aleda Grana) 50 MCG/ACT nasal spray  10/04/16   [provider]  PARoxetine (PAXIL) 20 MG tablet Take 20 mg by mouth. 11/05/16   [provider]      Allergies    Bee venom    Review of Systems   Review of Systems  Physical Exam Updated Vital Signs BP 134/76   Pulse 64   Temp 99.4 F (37.4 C) (Oral)   Resp 17   Ht 5\' 10"  (1.778 m)   Wt 124.3 kg   SpO2 97%   BMI 39.31 kg/m   Physical Exam Vitals and nursing note reviewed.   Constitutional:      General: He is not in acute distress.    Appearance: He is well-developed.  HENT:     Head: Normocephalic and atraumatic.  Eyes:     General:        Right eye: No discharge.        Left eye: No discharge.     Conjunctiva/sclera: Conjunctivae normal.  Cardiovascular:     Rate and Rhythm: Normal rate and regular rhythm.     Heart sounds: Normal heart sounds.  Pulmonary:     Effort: Pulmonary effort is normal.     Breath sounds: Normal breath sounds.  Abdominal:     Palpations: Abdomen is soft.     Tenderness: There is generalized abdominal tenderness (mild) and tenderness in the left lower quadrant. There is no guarding or rebound.  Musculoskeletal:     Cervical back: Normal range of motion and neck supple.  Skin:    General: Skin is warm and dry.  Neurological:     Mental Status: He is alert.    ED Results / Procedures / Treatments   Labs (all labs ordered are listed, but only abnormal results are displayed) Labs Reviewed  COMPREHENSIVE METABOLIC PANEL - Abnormal; Notable for the following components:      Result Value   Glucose, Bld 114 (*)  Calcium 8.8 (*)    AST 49 (*)    ALT 47 (*)    All other components within normal limits  CBC - Abnormal; Notable for the following components:   Hemoglobin 12.6 (*)    MCV 74.8 (*)    MCH 23.7 (*)    RDW 15.6 (*)    All other components within normal limits  LIPASE, BLOOD  URINALYSIS, ROUTINE W REFLEX MICROSCOPIC    EKG None  Radiology CT ABDOMEN PELVIS W CONTRAST  Result Date: 10/29/2021 CLINICAL DATA:  Abdominal pain and history of rectal bleeding EXAM: CT ABDOMEN AND PELVIS WITH CONTRAST TECHNIQUE: Multidetector CT imaging of the abdomen and pelvis was performed using the standard protocol following bolus administration of intravenous contrast. RADIATION DOSE REDUCTION: This exam was performed according to the departmental dose-optimization program which includes automated exposure control,  adjustment of the mA and/or kV according to patient size and/or use of iterative reconstruction technique. CONTRAST:  OMNIPAQUE IOHEXOL 300 MG/ML  SOLN COMPARISON:  None Available. FINDINGS: Lower chest: No acute abnormality. Hepatobiliary: No focal liver abnormality is seen. No gallstones, gallbladder wall thickening, or biliary dilatation. Pancreas: Unremarkable. No pancreatic ductal dilatation or surrounding inflammatory changes. Spleen: Normal in size without focal abnormality. Adrenals/Urinary Tract: Adrenal glands are within normal limits. Kidneys demonstrate nonobstructing renal stone on the right. This measures approximately 3 mm. Collecting systems and ureters are within normal limits. The bladder is decompressed. Stomach/Bowel: Colon demonstrates mild edema in the wall involving the descending colon with mild inflammatory change. This likely represents focal colitis. The proximal colon is within normal limits. The appendix is unremarkable. Small bowel and stomach are unremarkable. Vascular/Lymphatic: No significant vascular findings are present. No enlarged abdominal or pelvic lymph nodes. Reproductive: Prostate is unremarkable. Other: No abdominal wall hernia or abnormality. No abdominopelvic ascites. 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 IMPRESSION: Changes consistent with colitis in the descending colon with wall thickening/edema and mild pericolonic inflammatory change. Nonobstructing right renal stone. Electronically Signed   By: Alcide Clever M.D.   On: 10/29/2021 21:17    Procedures Procedures    Medications Ordered in ED Medications  ciprofloxacin (CIPRO) tablet 500 mg (has no administration in time range)  metroNIDAZOLE (FLAGYL) tablet 500 mg (has no administration in time range)  iohexol (OMNIPAQUE) 300 MG/ML solution 100 mL (100 mLs Intravenous Contrast Given 10/29/21 2100)    ED Course/ Medical Decision Making/ A&P    Patient  seen and examined. History obtained directly from patient. Work-up including labs, imaging, EKG ordered in triage, if performed, were reviewed.    Labs/EKG: Independently reviewed and interpreted.  This included: CBC with white blood cell count 9.4, hemoglobin mild anemia 12.6; CMP minimally elevated transaminases, normal kidney function, otherwise unremarkable; lipase normal; UA unremarkable.  Imaging: Ordered CT imaging of the abdomen and pelvis.  Medications/Fluids: None ordered.  Most recent vital signs reviewed and are as follows: BP 134/76   Pulse 64   Temp 99.4 F (37.4 C) (Oral)   Resp 17   Ht 5\' 10"  (1.778 m)   Wt 124.3 kg   SpO2 97%   BMI 39.31 kg/m   Initial impression: Abdominal pain, lower GI bleed.  9:47 PM Reassessment performed. Patient appears comfortable, stable.  Imaging personally visualized and interpreted including: CT of the abdomen pelvis with contrast, agree descending colitis.  Reviewed pertinent lab work and imaging with patient at bedside. Questions answered.   Most current vital signs  reviewed and are as follows: BP 138/90   Pulse 63   Temp 99.4 F (37.4 C) (Oral)   Resp 16   Ht 5\' 10"  (1.778 m)   Wt 124.3 kg   SpO2 98%   BMI 39.31 kg/m   Plan: Discharge to home.   Prescriptions written for: Cipro, Flagyl  Other home care instructions discussed: Bland diet, rest  ED return instructions discussed: The patient was urged to return to the Emergency Department immediately with worsening of current symptoms, worsening abdominal pain, persistent vomiting, larger amounts of blood noted in stools, fever, or any other concerns. The patient verbalized understanding.   Follow-up instructions discussed: Patient encouraged to follow-up with their PCP in 3-5 days.                            Medical Decision Making Amount and/or Complexity of Data Reviewed Labs: ordered. Radiology: ordered.  Risk Prescription drug management.   For this  patient's complaint of abdominal pain, the following conditions were considered on the differential diagnosis: gastritis/PUD, enteritis/duodenitis, appendicitis, cholelithiasis/cholecystitis, cholangitis, pancreatitis, ruptured viscus, colitis, diverticulitis, small/large bowel obstruction, proctitis, cystitis, pyelonephritis, ureteral colic, aortic dissection, aortic aneurysm. Atypical chest etiologies were also considered including ACS, PE, and pneumonia.   CT scan suggestive of descending colitis.  This is congruent with the patient's history and physical exam and likely the cause of bleeding.  Low concern for clinically significant GI bleed at this time and patient seems reliable to return if symptoms worsen.  The patient's vital signs, pertinent lab work and imaging were reviewed and interpreted as discussed in the ED course. Hospitalization was considered for further testing, treatments, or serial exams/observation. However as patient is well-appearing, has a stable exam, and reassuring studies today, I do not feel that they warrant admission at this time. This plan was discussed with the patient who verbalizes agreement and comfort with this plan and seems reliable and able to return to the Emergency Department with worsening or changing symptoms.          Final Clinical Impression(s) / ED Diagnoses Final diagnoses:  Colitis    Rx / DC Orders ED Discharge Orders          Ordered    ciprofloxacin (CIPRO) 500 MG tablet  2 times daily        10/29/21 2144    metroNIDAZOLE (FLAGYL) 500 MG tablet  3 times daily        10/29/21 2144              2145, PA-C 10/29/21 2149    2150, MD 10/31/21 1350

## 2023-04-23 ENCOUNTER — Encounter: Payer: Self-pay | Admitting: Family Medicine

## 2023-05-24 ENCOUNTER — Other Ambulatory Visit: Payer: Self-pay

## 2023-05-24 ENCOUNTER — Inpatient Hospital Stay: Payer: 59 | Attending: Nurse Practitioner | Admitting: Nurse Practitioner

## 2023-05-24 ENCOUNTER — Inpatient Hospital Stay: Payer: 59

## 2023-05-24 VITALS — BP 128/88 | HR 64 | Temp 98.2°F | Resp 18 | Wt 271.1 lb

## 2023-05-24 DIAGNOSIS — G4733 Obstructive sleep apnea (adult) (pediatric): Secondary | ICD-10-CM | POA: Insufficient documentation

## 2023-05-24 DIAGNOSIS — D509 Iron deficiency anemia, unspecified: Secondary | ICD-10-CM

## 2023-05-24 LAB — CBC WITH DIFFERENTIAL (CANCER CENTER ONLY)
Abs Immature Granulocytes: 0.01 10*3/uL (ref 0.00–0.07)
Basophils Absolute: 0 10*3/uL (ref 0.0–0.1)
Basophils Relative: 0 %
Eosinophils Absolute: 0.2 10*3/uL (ref 0.0–0.5)
Eosinophils Relative: 3 %
HCT: 39.4 % (ref 39.0–52.0)
Hemoglobin: 12 g/dL — ABNORMAL LOW (ref 13.0–17.0)
Immature Granulocytes: 0 %
Lymphocytes Relative: 31 %
Lymphs Abs: 2.2 10*3/uL (ref 0.7–4.0)
MCH: 20.4 pg — ABNORMAL LOW (ref 26.0–34.0)
MCHC: 30.5 g/dL (ref 30.0–36.0)
MCV: 67 fL — ABNORMAL LOW (ref 80.0–100.0)
Monocytes Absolute: 0.7 10*3/uL (ref 0.1–1.0)
Monocytes Relative: 9 %
Neutro Abs: 4 10*3/uL (ref 1.7–7.7)
Neutrophils Relative %: 57 %
Platelet Count: 290 10*3/uL (ref 150–400)
RBC: 5.88 MIL/uL — ABNORMAL HIGH (ref 4.22–5.81)
RDW: 18.5 % — ABNORMAL HIGH (ref 11.5–15.5)
Smear Review: NORMAL
WBC Count: 7.1 10*3/uL (ref 4.0–10.5)
nRBC: 0 % (ref 0.0–0.2)

## 2023-05-24 LAB — IRON AND IRON BINDING CAPACITY (CC-WL,HP ONLY)
Iron: 26 ug/dL — ABNORMAL LOW (ref 45–182)
Saturation Ratios: 5 % — ABNORMAL LOW (ref 17.9–39.5)
TIBC: 550 ug/dL — ABNORMAL HIGH (ref 250–450)
UIBC: 524 ug/dL

## 2023-05-24 LAB — RETICULOCYTES
Immature Retic Fract: 23.1 % — ABNORMAL HIGH (ref 2.3–15.9)
RBC.: 5.87 MIL/uL — ABNORMAL HIGH (ref 4.22–5.81)
Retic Count, Absolute: 55.2 10*3/uL (ref 19.0–186.0)
Retic Ct Pct: 0.9 % (ref 0.4–3.1)

## 2023-05-24 LAB — FOLATE: Folate: 28.6 ng/mL (ref >5.9)

## 2023-05-24 NOTE — Assessment & Plan Note (Signed)
Iron deficiency anemia.  History includes erectile dysfunction, anxiety, depression, obstructive sleep apnea, and low testosterone. Referred from [facility] in [place], [place]. He was evaluated there for erectile dysfunction in October 2024. Initial blood panel showed low testosterone at 213, with high DHEA at 413.7. he had normal Hgb, Hct, and platelets. He was started on 200 mg [place] testosterone with weekly injections of 200 mg [place] for 4 weeks.discussed addition of anastrozole in future. Subsequent monitoring of labs done 03/31/2023 showed normal testosterone level at 742 with normal PSA of 0.8. DHEA elevated at 400.4 and now, estradiol elevated at 45.3. his blood count is also abnormal. Hgb is 11.6, hct 27.4, MCV 74, platelet count normal at 306.  Reviewing records from [facility] system, CBC from 01/29/2023 did show mild microcytic anemia with normal Hgb at 13.5, Hct 38.8, MCV 75.5, MCH 26.3, and normal platelets at 279.  02/10/2023 - iron saturation 8%, iron 31, UIBC 368, TIBC 398. 01/2022 - Hgb 12.2, Hct 36.7, MCV 73.4, MCH 24.4, and normal platelets at 350.  03/2021 - Hgb 13.6, Hct 42.7, MCV 77.5, MCH 24.7, and normal platelets at 283.  11/2018 - CBC was normal A CT scan of his abdomen and pelvis done 10/29/2021 did indicate history of colitis and inflammation in descending colon.  He did have a normal EKC on 10/31/2021.  He underwent CT calcium scoring test. Total calcium score was "0". There were no abnormal extra-cardiac findings on CT Angio coronary calcium scoring test.  His last colonoscopy was 11/25/2021 at [facility]. His results were normal. He had no evidence of IBD, recall is expected in 10 years.  -Will order new iron panel and anemia workup.  Will also check for celiac disease markers.  Will recommend patient be seen by GI provider for upper endoscopy. -Will recommend he try oral iron supplements.  Will send prescription for Accrufer if he is still iron deficient.  Will also consider IV iron to  boost ferritin and improve energy as he is symptomatic for anemia. -Patient voices understanding and agreement with the current plan.

## 2023-05-24 NOTE — Progress Notes (Addendum)
[facility]   [phone] [phone]   Clinic New consult Note   Patient Care Team: Tracey Harries, MD as PCP - General (Family Medicine) 05/24/2023  CHIEF COMPLAINTS/PURPOSE OF CONSULTATION:  Iron deficiency anemia   HISTORY OF PRESENTING ILLNESS:  Carl Taylor 48 y.o. male is here because of iron deficiency anemia.  History includes erectile dysfunction, anxiety, depression, obstructive sleep apnea, and low testosterone. Referred from [facility] in [place], [place]. He was evaluated there for erectile dysfunction in October 2024. Initial blood panel showed low testosterone at 213, with high DHEA at 413.7. he had normal Hgb, Hct, and platelets. He was started on 200 mg [place] testosterone with weekly injections of 200 mg [place] for 4 weeks.discussed addition of anastrozole in future. Subsequent monitoring of labs done 03/31/2023 showed normal testosterone level at 742 with normal PSA of 0.8. DHEA elevated at 400.4 and now, estradiol elevated at 45.3. his blood count is also abnormal. Hgb is 11.6, hct 27.4, MCV 74, platelet count normal at 306.  Reviewing records from [facility] system, CBC from 01/29/2023 did show mild microcytic anemia with normal Hgb at 13.5, Hct 38.8, MCV 75.5, MCH 26.3, and normal platelets at 279.  02/10/2023 - iron saturation 8%, iron 31, UIBC 368, TIBC 398. 01/2022 - Hgb 12.2, Hct 36.7, MCV 73.4, MCH 24.4, and normal platelets at 350.  03/2021 - Hgb 13.6, Hct 42.7, MCV 77.5, MCH 24.7, and normal platelets at 283.  11/2018 - CBC was normal A CT scan of his abdomen and pelvis done 10/29/2021 did indicate history of colitis and inflammation in descending colon.  He did have a normal EKC on 10/31/2021.  He underwent CT calcium scoring test. Total calcium score was "0". There were no abnormal extra-cardiac findings on CT Angio coronary calcium scoring test.  His last colonoscopy was 11/25/2021 at [facility]. His results were normal. He had no evidence of  IBD, recall is expected in 10 years.  Socially, the patient is married and lives with his 2 children.  He has no history of abdominal surgeries.  He does not smoke.  He used to indulge in chewing tobacco.  Quit this in 2018 when he was considering weight loss surgery.  He drinks very seldomly.  He does not use illicit or illegal drugs.  His family history is positive for Crohn's disease with both his brother and father.  States his father has also had prostate cancer.  He denies recent chest pain on exertion.  He has had episodes of shortness of breath on minimal exertion, pre-syncopal episodes, and palpitations. He had not noticed any recent bleeding such as epistaxis, hematuria or hematochezia The patient denies over the counter NSAID ingestion. He is not  on antiplatelets agents. His last colonoscopy was 11/25/2021 and results were normal. Recall expected in 10 years.  He had no prior history or diagnosis of cancer. His age appropriate screening programs are up-to-date. He denies any pica and eats a variety of diet. He donates blood routinely.  Has donated 1 unit of blood over the past few months.  Has donated multiple units of platelets in the last 6 months. The patient was not prescribed oral iron supplements.  He is concerned about taking oral iron supplements as he does suffer from constipation.  He does take [facility] which has an expected side effect of constipation.  Already takes MiraLAX every other day.   REVIEW OF SYSTEMS:   Constitutional: Denies fevers, chills or abnormal night sweats.  He does suffer from moderate fatigue even with adequate rest.  States he falls asleep very easily if sitting still for short period of time.  He does have diagnosis of obstructive sleep apnea.  Should be using CPAP, but has had a hard time finding an appropriate mask. Eyes: Denies blurriness of vision, double vision or watery eyes Ears, nose, mouth, throat, and face: Denies mucositis or sore  throat Respiratory: Denies cough, dyspnea or wheezes Cardiovascular: Has had episodes of palpitations.  Denies chest discomfort or lower extremity swelling Gastrointestinal:  Denies nausea, heartburn or change in bowel habits Skin: Denies abnormal skin rashes Lymphatics: Denies new lymphadenopathy or easy bruising Neurological:Denies numbness, tingling or new weaknesses Behavioral/Psych: Mood is stable, no new changes   All other systems were reviewed with the patient and are negative.   MEDICAL HISTORY:  Past Medical History:  Diagnosis Date   Allergic rhinitis    Anal fissure    Anxiety    GERD (gastroesophageal reflux disease)    Hearing loss    Hypertension    OCD (obsessive compulsive disorder)     SURGICAL HISTORY: Past Surgical History:  Procedure Laterality Date   LASIK     NASAL SEPTUM SURGERY     VASECTOMY      SOCIAL HISTORY: Social History   Socioeconomic History   Marital status: Married    Spouse name: Not on file   Number of children: 2   Years of education: Not on file   Highest education level: Not on file  Occupational History   Not on file  Tobacco Use   Smoking status: Never   Smokeless tobacco: Current    Types: Chew  Vaping Use   Vaping status: Never Used  Substance and Sexual Activity   Alcohol use: No   Drug use: No   Sexual activity: Not on file  Other Topics Concern   Not on file  Social History Narrative   Not on file   Social Drivers of Health   Financial Resource Strain: Low Risk  (02/15/2023)   Received from [facility]   Overall Financial Resource Strain (CARDIA)    Difficulty of Paying Living Expenses: Not hard at all  Food Insecurity: No Food Insecurity (05/24/2023)   Hunger Vital Sign    Worried About Running Out of Food in the Last Year: Never true    Ran Out of Food in the Last Year: Never true  Transportation Needs: No Transportation Needs (05/24/2023)   PRAPARE - Administrator, Civil Service  (Medical): No    Lack of Transportation (Non-Medical): No  Physical Activity: Insufficiently Active (02/15/2023)   Received from [facility]   Exercise Vital Sign    Days of Exercise per Week: 2 days    Minutes of Exercise per Session: 30 min  Stress: Stress Concern Present (02/15/2023)   Received from [facility] of Occupational Health - Occupational Stress Questionnaire    Feeling of Stress : To some extent  Social Connections: Socially Integrated (02/15/2023)   Received from [facility]   Social Connection and Isolation Panel [NHANES]    Frequency of Communication with Friends and Family: More than three times a week    Frequency of Social Gatherings with Friends and Family: More than three times a week    Attends Religious Services: More than 4 times per year    Active Member of [employer] or Organizations: Yes    Attends Banker Meetings: More  than 4 times per year    Marital Status: Married  Catering manager Violence: Not At Risk (02/15/2023)   Received from [facility]   Humiliation, Afraid, Rape, and Kick questionnaire    Fear of Current or Ex-Partner: No    Emotionally Abused: No    Physically Abused: No    Sexually Abused: No    FAMILY HISTORY: Family History  Problem Relation Age of Onset   Hypertension Paternal Grandfather    Diabetes Paternal Grandfather    Autoimmune disease Daughter     ALLERGIES:  is allergic to bee venom.  MEDICATIONS:  Current Outpatient Medications  Medication Sig Dispense Refill   lactobacillus acidophilus (BACID) TABS tablet Take 2 tablets by mouth 3 (three) times daily.     Multiple Vitamin (MULTIVITAMIN) tablet Take 1 tablet by mouth daily.     polyethylene glycol (MIRALAX / GLYCOLAX) 17 g packet Take 17 g by mouth daily.     tirzepatide (MOUNJARO) 7.5 MG/0.5ML Pen Inject 7.5 mg into the skin once a week.     VITAMIN D, CHOLECALCIFEROL, PO Take by mouth.     cetirizine (ZYRTEC) 10 MG tablet Take  20 mg by mouth.     clotrimazole (LOTRIMIN AF) 1 % cream Apply perianally twice daily x 1 week 30 g 0   fluticasone (FLONASE) 50 MCG/ACT nasal spray      PARoxetine (PAXIL) 20 MG tablet Take 20 mg by mouth.     No current facility-administered medications for this visit.    PHYSICAL EXAMINATION: ECOG PERFORMANCE STATUS: 1 - Symptomatic but completely ambulatory  Vitals:   05/24/23 1452  BP: 128/88  Pulse: 64  Resp: 18  Temp: 98.2 F (36.8 C)  SpO2: 97%   Filed Weights   05/24/23 1452  Weight: 271 lb 1.6 oz (123 kg)    GENERAL:alert, no distress and comfortable SKIN: skin color, texture, turgor are normal, no rashes or significant lesions EYES: normal, conjunctiva are pink and non-injected, sclera clear OROPHARYNX:no exudate, no erythema and lips, buccal mucosa, and tongue normal  NECK: supple, thyroid normal size, non-tender, without nodularity LYMPH:  no palpable lymphadenopathy in the cervical, axillary or inguinal LUNGS: clear to auscultation and percussion with normal breathing effort HEART: regular rate & rhythm and no murmurs and no lower extremity edema ABDOMEN:abdomen soft, non-tender and normal bowel sounds Musculoskeletal:no cyanosis of digits and no clubbing  PSYCH: alert & oriented x 3 with fluent speech NEURO: no focal motor/sensory deficits  LABORATORY DATA:  I have reviewed the data as listed    Latest Ref Rng & Units 05/24/2023    4:04 PM 10/29/2021    6:45 PM 04/06/2021    5:25 PM  CBC  WBC 4.0 - 10.5 K/uL 7.1  9.4  7.6   Hemoglobin 13.0 - 17.0 g/dL 16.1  09.6  04.5   Hematocrit 39.0 - 52.0 % 39.4  39.7  42.7   Platelets 150 - 400 K/uL 290  327  283        Latest Ref Rng & Units 10/29/2021    6:45 PM 04/06/2021    5:25 PM  CMP  Glucose 70 - 99 mg/dL 409  811   BUN 6 - 20 mg/dL 18  13   Creatinine 9.14 - 1.24 mg/dL 7.82  9.56   Sodium 213 - 145 mmol/L 136  137   Potassium 3.5 - 5.1 mmol/L 3.9  4.1   Chloride 98 - 111 mmol/L 105  103   CO2 22 -  32 mmol/L 23  25   Calcium 8.9 - 10.3 mg/dL 8.8  9.3   Total Protein 6.5 - 8.1 g/dL 7.3  7.0   Total Bilirubin 0.3 - 1.2 mg/dL 0.8  0.3   Alkaline Phos 38 - 126 U/L 63  64   AST 15 - 41 U/L 49  25   ALT 0 - 44 U/L 47  28    Iron/TIBC/Ferritin/ %Sat    Component Value Date/Time   IRON 26 (L) 05/24/2023 1604   TIBC 550 (H) 05/24/2023 1604   IRONPCTSAT 5 (L) 05/24/2023 1604     Assessment and plan: Iron deficiency anemia, unspecified iron deficiency anemia type Assessment & Plan: Iron deficiency anemia.  History includes erectile dysfunction, anxiety, depression, obstructive sleep apnea, and low testosterone. Referred from [facility] in [place], [place]. He was evaluated there for erectile dysfunction in October 2024. Initial blood panel showed low testosterone at 213, with high DHEA at 413.7. he had normal Hgb, Hct, and platelets. He was started on 200 mg [place] testosterone with weekly injections of 200 mg [place] for 4 weeks.discussed addition of anastrozole in future. Subsequent monitoring of labs done 03/31/2023 showed normal testosterone level at 742 with normal PSA of 0.8. DHEA elevated at 400.4 and now, estradiol elevated at 45.3. his blood count is also abnormal. Hgb is 11.6, hct 27.4, MCV 74, platelet count normal at 306.  Reviewing records from [facility] system, CBC from 01/29/2023 did show mild microcytic anemia with normal Hgb at 13.5, Hct 38.8, MCV 75.5, MCH 26.3, and normal platelets at 279.  02/10/2023 - iron saturation 8%, iron 31, UIBC 368, TIBC 398. 01/2022 - Hgb 12.2, Hct 36.7, MCV 73.4, MCH 24.4, and normal platelets at 350.  03/2021 - Hgb 13.6, Hct 42.7, MCV 77.5, MCH 24.7, and normal platelets at 283.  11/2018 - CBC was normal A CT scan of his abdomen and pelvis done 10/29/2021 did indicate history of colitis and inflammation in descending colon.  He did have a normal EKC on 10/31/2021.  He underwent CT calcium scoring test. Total calcium score was "0". There were no abnormal  extra-cardiac findings on CT Angio coronary calcium scoring test.  His last colonoscopy was 11/25/2021 at [facility]. His results were normal. He had no evidence of IBD, recall is expected in 10 years.  -Will order new iron panel and anemia workup.  Will also check for celiac disease markers.  Will recommend patient be seen by GI provider for upper endoscopy. -Will recommend he try oral iron supplements.  Will send prescription for Accrufer if he is still iron deficient.  Will also consider IV iron to boost ferritin and improve energy as he is symptomatic for anemia. -Patient voices understanding and agreement with the current plan.  Orders: -     CBC with Differential (Cancer Center Only); Future -     Iron and Iron Binding Capacity ([facility] only); Future -     Ferritin; Future -     Folate; Future -     Reticulocytes; Future -     Celiac panel 10; Future     Orders Placed This Encounter  Procedures   CBC with Differential (Cancer Center Only)    Standing Status:   Future    Number of Occurrences:   1    Expected Date:   05/24/2023    Expiration Date:   05/23/2024   Iron and Iron Binding Capacity ([facility] only)    Standing Status:   Future  Number of Occurrences:   1    Expected Date:   05/24/2023    Expiration Date:   05/23/2024   Ferritin    Standing Status:   Future    Number of Occurrences:   1    Expected Date:   05/24/2023    Expiration Date:   05/23/2024   Folate, Serum    Standing Status:   Future    Number of Occurrences:   1    Expected Date:   05/24/2023    Expiration Date:   05/23/2024   Reticulocytes    Standing Status:   Future    Number of Occurrences:   1    Expected Date:   05/24/2023    Expiration Date:   05/23/2024   Celiac panel 10    Standing Status:   Future    Number of Occurrences:   1    Expected Date:   05/24/2023    Expiration Date:   05/23/2024    All questions were answered. The patient knows to call the clinic with any problems, questions or  concerns. The total time spent in the appointment was 30 minutes.  This patient was seen along with Dr. Ermelinda Das, hematology/oncology.    Carlean Jews, NP 05/24/2023 9:21 PM  Addendum I have seen the patient, examined him. I agree with the assessment and and plan and have edited the notes.   48 year old male with past medical history of sleep apnea, low testosterone, anxiety/depression, who were referred for mild anemia.  His lab work consistent with iron deficient anemia, no clinical evidence of bleeding.  Etiology of iron deficient anemia is on certain, will check a celiac panel, and recommend patient to see GI for endoscopy.  He has not started any oral iron.  We encouraged him to try oral iron first, will send prescription Accrufer which has less GI side effect.  If he has an adequate response to oral iron, will consider IV iron.  Benefit and side effect discussed with him, he is interested.  He agrees to try oral iron first.  All questions were answered.  I spent a total of 30 minutes for his visit today, more than 50% time on face-to-face counseling.  Malachy Mood MD 05/24/2023

## 2023-05-25 LAB — FERRITIN: Ferritin: 5 ng/mL — ABNORMAL LOW (ref 24–336)

## 2023-05-26 LAB — CELIAC PANEL 10
Antigliadin Abs, IgA: 7 U (ref 0–19)
Endomysial Ab, IgA: NEGATIVE
Gliadin IgG: 2 U (ref 0–19)
IgA: 226 mg/dL (ref 90–386)
Tissue Transglut Ab: 2 U/mL (ref 0–5)
Tissue Transglutaminase Ab, IgA: <2 U/mL (ref 0–3)

## 2023-06-21 ENCOUNTER — Encounter: Payer: Self-pay | Admitting: Nurse Practitioner

## 2023-07-08 ENCOUNTER — Other Ambulatory Visit: Payer: Self-pay

## 2023-10-24 IMAGING — CT CT HEAD W/O CM
3 series · 16 of 47 positions shown, 19 images · non-contrast
Comparison: None.

CLINICAL DATA: Head trauma, moderate-severe.  Syncope, hit head

EXAM:
CT HEAD WITHOUT CONTRAST
TECHNIQUE: Contiguous axial images were obtained from the base of the skull
through the vertex without intravenous contrast.

[Series 2: head wo · axial · 0.48mm/px · z∈[+911,+1041]mm · 10 of 32 slices shown, 13 images]
[im 3/32  brain]
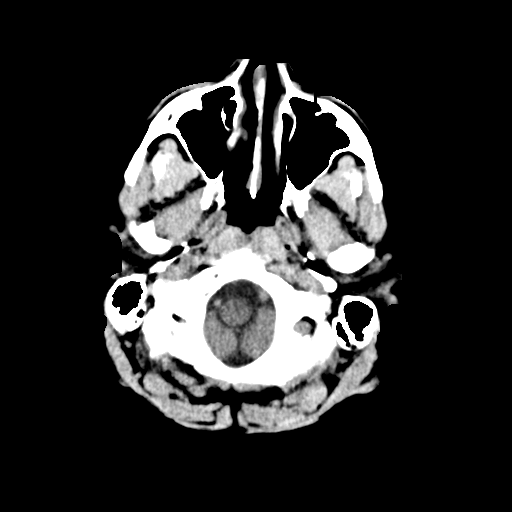
[im 3/32  bone]
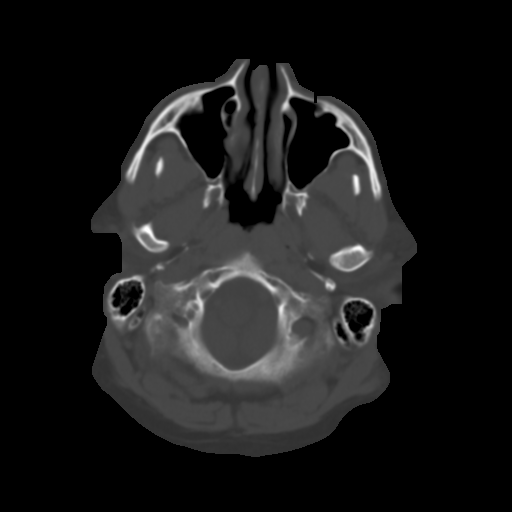
[im 6/32  brain]
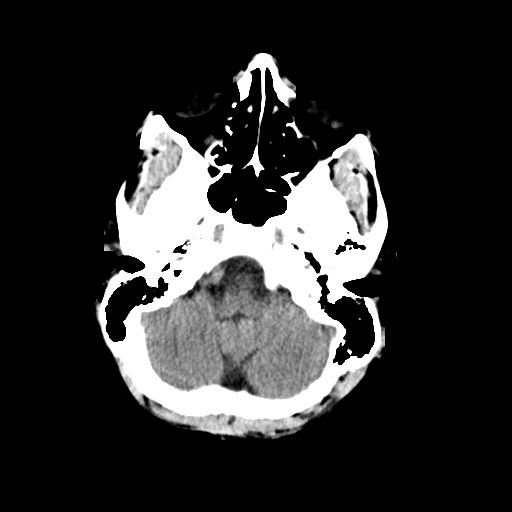
[im 9/32  brain]
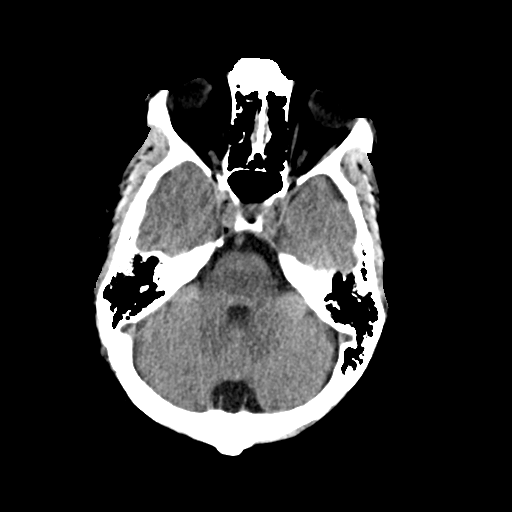
[im 11/32  brain]
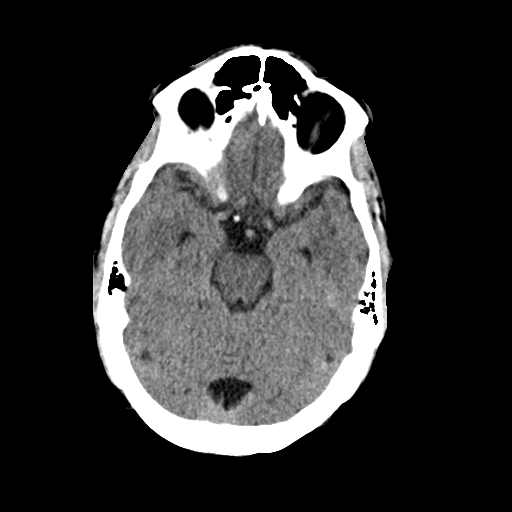
[im 14/32  brain]
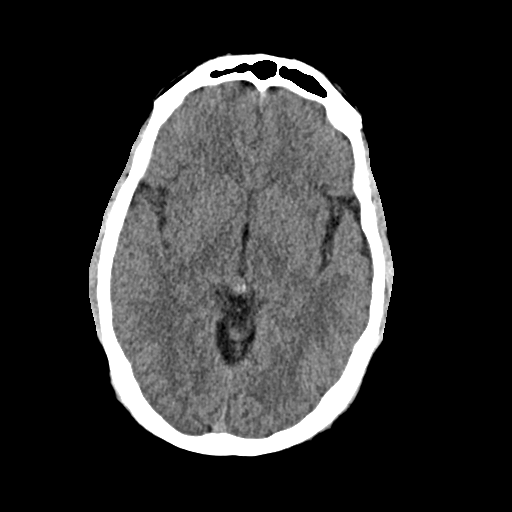
[im 14/32  bone]
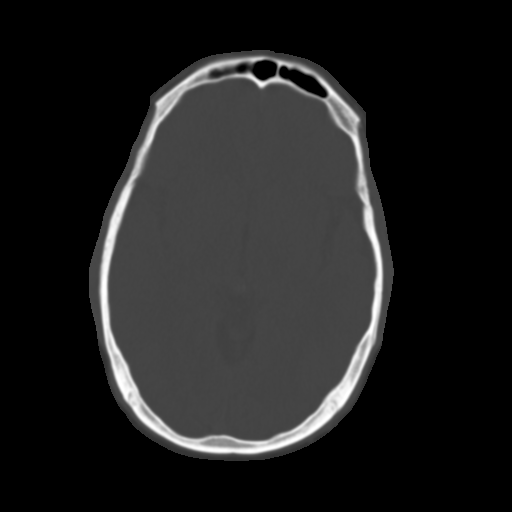
[im 18/32  brain]
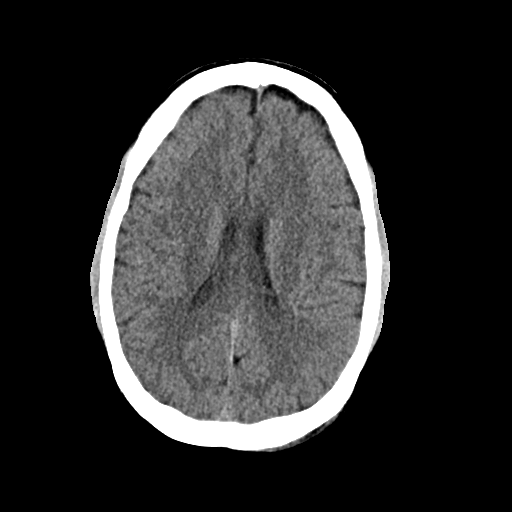
[im 21/32  brain]
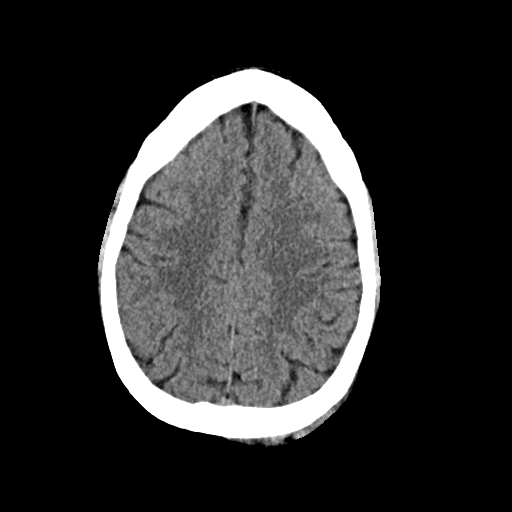
[im 24/32  brain]
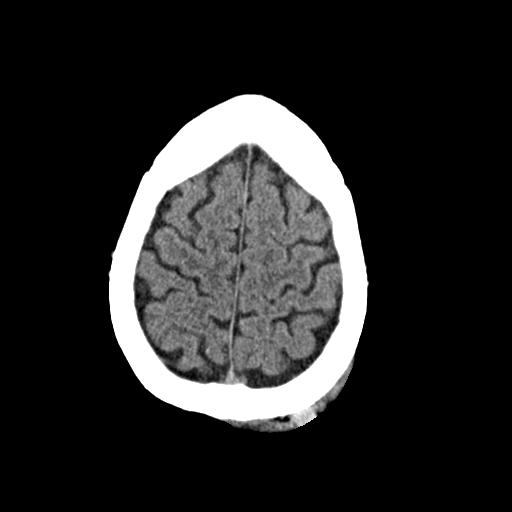
[im 26/32  brain]
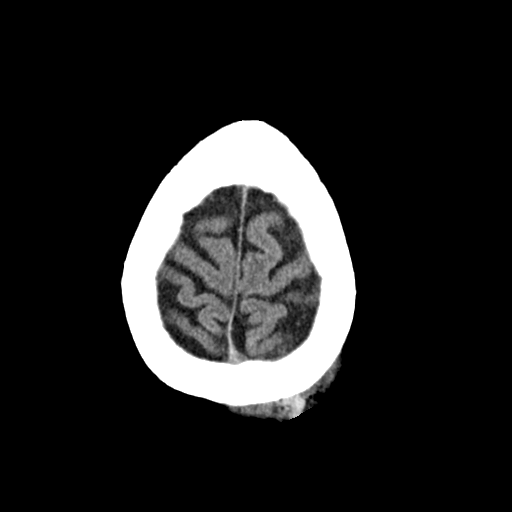
[im 26/32  bone]
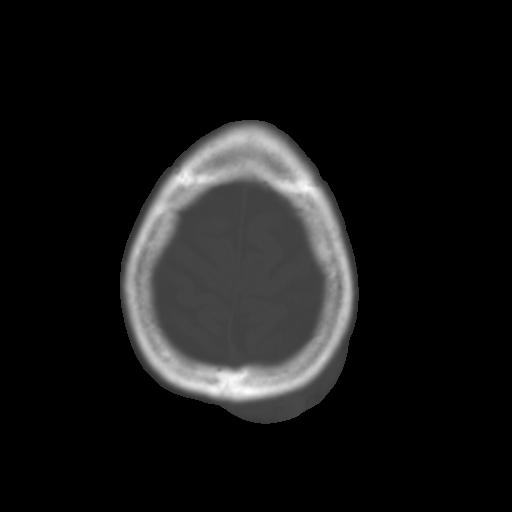
[im 29/32  brain]
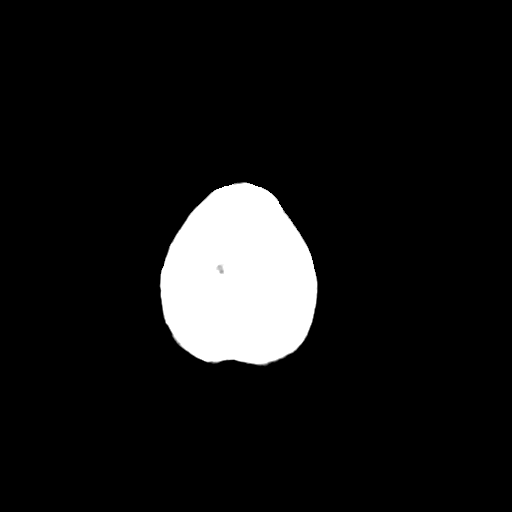

[Series 4: coronal soft · coronal · 0.34mm/px · 3 of 78 slices shown]
[im 26/78  brain]
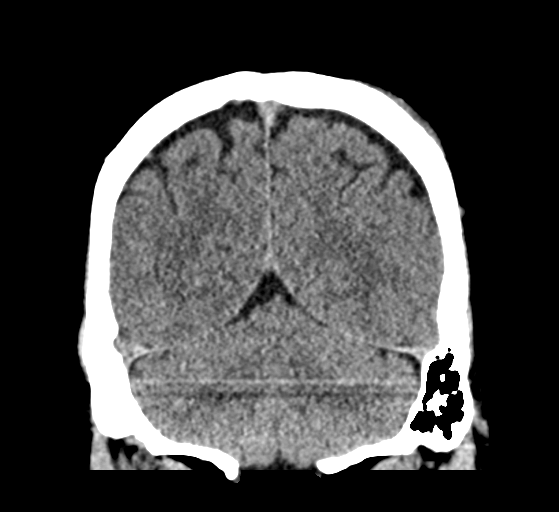
[im 35/78  brain]
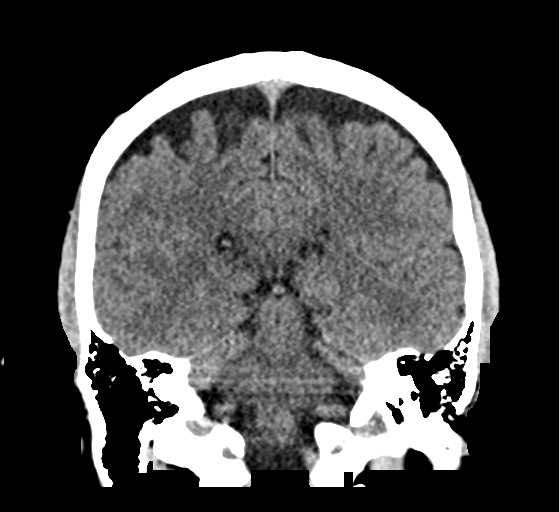
[im 43/78  brain]
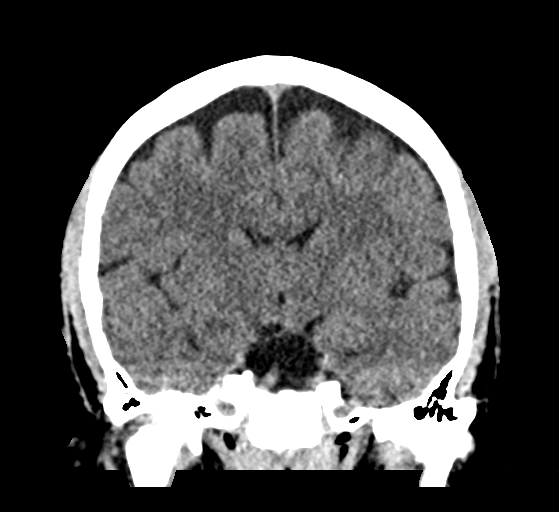

[Series 5: sag soft · sagittal · 0.35mm/px · 3 of 67 slices shown]
[im 23/67  brain]
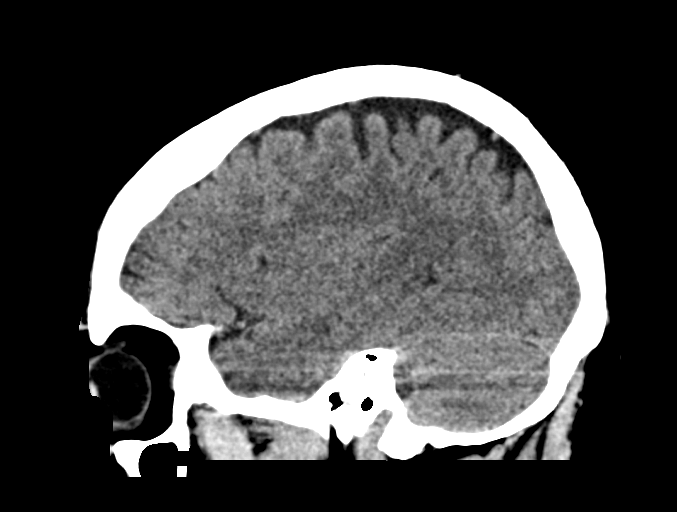
[im 34/67  brain]
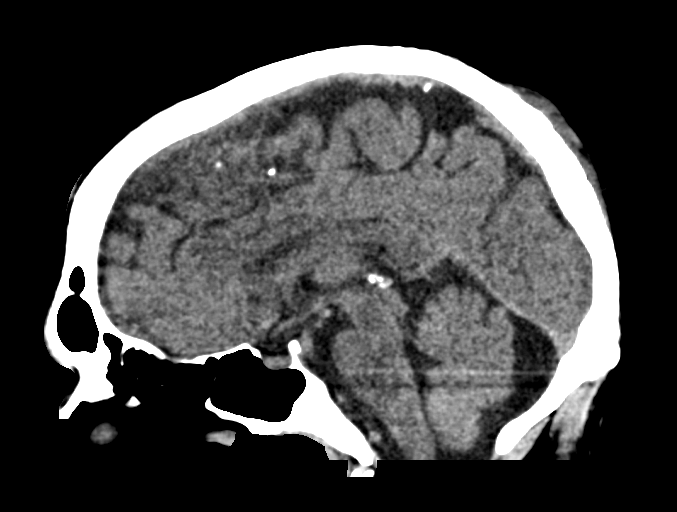
[im 45/67  brain]
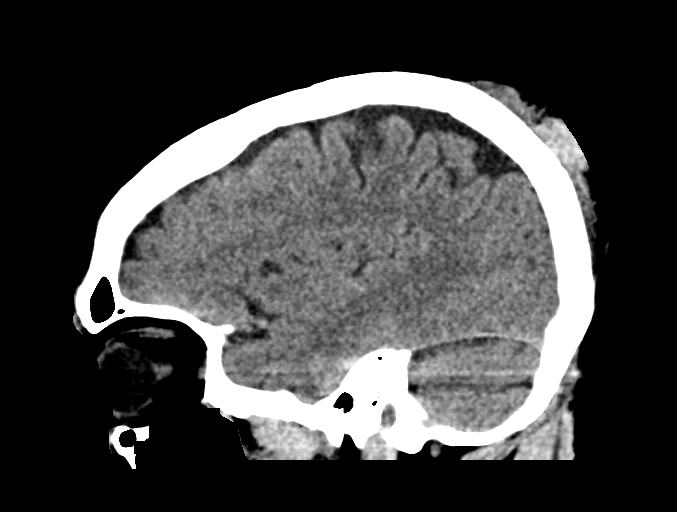

[16 of 47 positions shown; findings below may reference images not displayed]

FINDINGS: Brain: No acute intracranial abnormality. Specifically, no
hemorrhage, hydrocephalus, mass lesion, acute infarction, or
significant intracranial injury.

Vascular: No hyperdense vessel or unexpected calcification.

Skull: No acute calvarial abnormality.

Sinuses/Orbits: No acute findings

Other: Posterior scalp soft tissue swelling.
IMPRESSION: No acute intracranial abnormality.

## 2024-02-05 ENCOUNTER — Other Ambulatory Visit: Payer: Self-pay

## 2024-02-05 ENCOUNTER — Emergency Department (HOSPITAL_BASED_OUTPATIENT_CLINIC_OR_DEPARTMENT_OTHER)
Admission: EM | Admit: 2024-02-05 | Discharge: 2024-02-05 | Disposition: A | Discharge status: Home / Self Care | Attending: Emergency Medicine | Admitting: Emergency Medicine

## 2024-02-05 ENCOUNTER — Encounter (HOSPITAL_BASED_OUTPATIENT_CLINIC_OR_DEPARTMENT_OTHER): Payer: Self-pay

## 2024-02-05 DIAGNOSIS — S0101XA Laceration without foreign body of scalp, initial encounter: Secondary | ICD-10-CM | POA: Diagnosis not present

## 2024-02-05 DIAGNOSIS — W228XXA Striking against or struck by other objects, initial encounter: Secondary | ICD-10-CM | POA: Diagnosis not present

## 2024-02-05 DIAGNOSIS — S0990XA Unspecified injury of head, initial encounter: Secondary | ICD-10-CM | POA: Diagnosis present

## 2024-02-05 MED ORDER — BACITRACIN ZINC 500 UNIT/GM EX OINT: TOPICAL_OINTMENT | Freq: Two times a day (BID) | CUTANEOUS | Status: DC

## 2024-02-05 NOTE — ED Notes (Signed)
 Dr Griselda said the patient did not want to wait. She gave home bacitracin and discharged him.

## 2024-02-05 NOTE — ED Provider Notes (Signed)
 [place] [facility] Provider Note   CSN: [id number] Arrival date & time: 02/05/24  0534     Patient presents with: Laceration   Carl Taylor is a 48 y.o. male.   The history is provided by the patient.  Laceration Carl Taylor is a 48 y.o. male who presents to the Emergency Department complaining of head injury. He presents the emergency department after he struck his head this morning. He states that he was in bed and had his alarm set across the room to make sure he woke up and he somehow struck his head on the curio cabinet. He did feel briefly dizzy. No loss of consciousness. No associated fevers, chest pain, nausea, vomiting, numbness, weakness. He has no known medical problems. He complains of pain to the back of his head with some local bleeding.     Prior to Admission medications   Medication Sig Start Date End Date Taking? Authorizing Provider  cetirizine (ZYRTEC) 10 MG tablet Take 20 mg by mouth.    [provider]  clotrimazole  (LOTRIMIN  AF) 1 % cream Apply perianally twice daily x 1 week 01/15/17   Aneita Gwendlyn DASEN, MD  fluticasone (FLONASE) 50 MCG/ACT nasal spray  10/04/16   [provider]  lactobacillus acidophilus (BACID) TABS tablet Take 2 tablets by mouth 3 (three) times daily.    [provider]  Multiple Vitamin (MULTIVITAMIN) tablet Take 1 tablet by mouth daily.    [provider]  PARoxetine (PAXIL) 20 MG tablet Take 20 mg by mouth. 11/05/16   [provider]  polyethylene glycol (MIRALAX / GLYCOLAX) 17 g packet Take 17 g by mouth daily.    [provider]  tirzepatide CLOYDE) 7.5 MG/0.5ML Pen Inject 7.5 mg into the skin once a week.    [provider]  VITAMIN D, CHOLECALCIFEROL, PO Take by mouth.    [provider]    Allergies: Bee venom    Review of Systems  All other systems reviewed and are negative.   Updated Vital Signs BP 134/78    Pulse (!) 56   Temp 98.3 F (36.8 C) (Oral)   Resp 15   Ht 5' 10 (1.778 m)   Wt 123.8 kg   SpO2 95%   BMI 39.17 kg/m   Physical Exam Vitals and nursing note reviewed.  Constitutional:      Appearance: He is well-developed.  HENT:     Head: Normocephalic.     Comments: 3 cm laceration to the posterior scalp Cardiovascular:     Rate and Rhythm: Normal rate and regular rhythm.  Pulmonary:     Effort: Pulmonary effort is normal. No respiratory distress.  Abdominal:     Palpations: Abdomen is soft.     Tenderness: There is no abdominal tenderness. There is no guarding or rebound.  Musculoskeletal:        General: No tenderness.     Cervical back: Neck supple.  Skin:    General: Skin is warm and dry.  Neurological:     Mental Status: He is alert and oriented to person, place, and time.     Comments: Five out of five strength in all four extremities with sensational light touch intact in all four extremities  Psychiatric:        Behavior: Behavior normal.     (all labs ordered are listed, but only abnormal results are displayed) Labs Reviewed - No data to display  EKG: None  Radiology: No results found.   .Laceration Repair  Date/Time: 02/05/2024 6:14 AM  Performed by: Griselda Norris, MD Authorized by: Griselda Norris, MD   Consent:    Consent obtained:  Verbal   Consent given by:  Patient   Risks discussed:  Infection, pain and poor cosmetic result Universal protocol:    Patient identity confirmed:  Verbally with patient Anesthesia:    Anesthesia method:  None Laceration details:    Location:  Scalp   Scalp location:  Occipital   Length (cm):  3 Exploration:    Hemostasis achieved with:  Direct pressure Treatment:    Area cleansed with:  Chlorhexidine   Amount of cleaning:  Standard   Debridement:  None Skin repair:    Repair method:  Staples   Number of staples:  3 Approximation:    Approximation:  Close Repair type:    Repair type:   Simple Post-procedure details:    Dressing:  Antibiotic ointment    Medications Ordered in the ED  bacitracin ointment (has no administration in time range)                                    Medical Decision Making Risk OTC drugs.   Patient here for evaluation of scalp laceration that occurred when he struck his head getting up to turn off his alarm clock. He does have a large linear vertical laceration on the occipital scalp. He is low risk for serious close head injury. Wound repaired per procedure note. Discussed with patient recommendation for labs, EKG because he did feel slightly confused and disoriented for brief spell. He declines at this time. Discussed need for PCP follow-up as well as return precautions for progressive or new concerning symptoms.     Final diagnoses:  Laceration of scalp, initial encounter    ED Discharge Orders     None          Griselda Norris, MD 02/05/24 [phone]

## 2024-02-05 NOTE — ED Triage Notes (Signed)
 Patient states he woke up and thinks he hit his head on a cabinet . Patient does not truly remember what happened. States he was not bleeding from the back of the head when he woke up. Presents with laceration to the back of the head. Bleeding is controlled. Rates pain 8/10.

## 2024-02-05 NOTE — Discharge Instructions (Signed)
 You had three staples placed in the emergency department.  They will need to be removed in the next 5-7 days.
